# Patient Record
Sex: Female | Born: 1980 | Race: White | Hispanic: No | Marital: Married | Smoking: Former smoker
Health system: Southern US, Community
[De-identification: ages and names within clinical notes are randomized; demographics above are authoritative.]

## PROBLEM LIST (undated history)

## (undated) DIAGNOSIS — D649 Anemia, unspecified: Secondary | ICD-10-CM

## (undated) DIAGNOSIS — F32A Depression, unspecified: Secondary | ICD-10-CM

## (undated) DIAGNOSIS — E162 Hypoglycemia, unspecified: Secondary | ICD-10-CM

## (undated) HISTORY — DX: Depression, unspecified: F32.A

## (undated) HISTORY — DX: Anemia, unspecified: D64.9

---
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
## 2001-09-21 ENCOUNTER — Other Ambulatory Visit: Admission: RE | Admit: 2001-09-21 | Discharge: 2001-09-21 | Payer: Self-pay | Admitting: Obstetrics and Gynecology

## 2001-09-21 ENCOUNTER — Ambulatory Visit (HOSPITAL_COMMUNITY): Admission: RE | Admit: 2001-09-21 | Discharge: 2001-09-21 | Payer: Self-pay | Admitting: Obstetrics and Gynecology

## 2001-09-21 ENCOUNTER — Encounter: Payer: Self-pay | Admitting: Obstetrics and Gynecology

## 2001-09-23 ENCOUNTER — Inpatient Hospital Stay (HOSPITAL_COMMUNITY): Admission: AD | Admit: 2001-09-23 | Discharge: 2001-09-23 | Payer: Self-pay | Admitting: Obstetrics and Gynecology

## 2001-12-10 ENCOUNTER — Encounter (HOSPITAL_COMMUNITY): Admission: AD | Admit: 2001-12-10 | Discharge: 2001-12-19 | Payer: Self-pay | Admitting: Obstetrics and Gynecology

## 2001-12-20 ENCOUNTER — Inpatient Hospital Stay (HOSPITAL_COMMUNITY): Admission: AD | Admit: 2001-12-20 | Discharge: 2001-12-22 | Payer: Self-pay | Admitting: Obstetrics and Gynecology

## 2004-06-05 ENCOUNTER — Inpatient Hospital Stay (HOSPITAL_COMMUNITY): Admission: AD | Admit: 2004-06-05 | Discharge: 2004-06-05 | Payer: Self-pay | Admitting: Obstetrics and Gynecology

## 2004-10-04 ENCOUNTER — Other Ambulatory Visit: Admission: RE | Admit: 2004-10-04 | Discharge: 2004-10-04 | Payer: Self-pay | Admitting: Obstetrics and Gynecology

## 2004-12-07 ENCOUNTER — Emergency Department (HOSPITAL_COMMUNITY): Admission: EM | Admit: 2004-12-07 | Discharge: 2004-12-07 | Payer: Self-pay | Admitting: Emergency Medicine

## 2007-05-25 ENCOUNTER — Inpatient Hospital Stay (HOSPITAL_COMMUNITY): Admission: AD | Admit: 2007-05-25 | Discharge: 2007-05-25 | Payer: Self-pay | Admitting: Obstetrics and Gynecology

## 2007-08-17 ENCOUNTER — Inpatient Hospital Stay (HOSPITAL_COMMUNITY): Admission: RE | Admit: 2007-08-17 | Discharge: 2007-08-19 | Payer: Self-pay | Admitting: Obstetrics and Gynecology

## 2008-07-28 ENCOUNTER — Emergency Department (HOSPITAL_COMMUNITY): Admission: EM | Admit: 2008-07-28 | Discharge: 2008-07-28 | Payer: Self-pay | Admitting: Family Medicine

## 2010-07-23 NOTE — Discharge Summary (Signed)
NAME:  Crystal Lester, Crystal Lester               ACCOUNT NO.:  [id number]   MEDICAL RECORD NO.:  [id number]          PATIENT TYPE:  INP   LOCATION:  9131                          FACILITY:  [facility]   PHYSICIAN:  Zenaida Niece, M.D.DATE OF BIRTH:  December 20, 1980   DATE OF ADMISSION:  08/17/2007  DATE OF DISCHARGE:  08/19/2007                               DISCHARGE SUMMARY   ADMISSION DIAGNOSIS:  Intrauterine pregnancy at 40 weeks.   DISCHARGE DIAGNOSIS:  Intrauterine pregnancy at 40 weeks.   PROCEDURE:  On August 17, 2007, she had a spontaneous vaginal delivery.   HISTORY AND PHYSICAL:  This is a 30 year old white female gravida 4,  para 3-0-0-3 with an EGA of 40-plus weeks who presents for elective  induction.  Prenatal care was complicated by depression, treated with  Zoloft, and was otherwise essentially uncomplicated.   PRENATAL LABS:  Blood type is A negative with a negative antibody  screen.  RPR nonreactive.  Rubella immune.  Hepatitis B surface antigen  negative.  HIV negative.  Gonorrhea and Chlamydia negative.  Cystic  fibrosis negative.  Quad screen normal.  A 1-hour Glucola 81.  Group B  strep is negative.   PAST OB HISTORY:  Three vaginal deliveries at term without  complications.   PAST MEDICAL HISTORY:  1. Asthma.  2. Depression.  3. Irritable bowel syndrome.  4. Diverticulosis.   ALLERGIES:  Allergies to IODINE.   MEDICATIONS:  Zoloft.   PHYSICAL EXAMINATION:  VITAL SIGNS:  She is afebrile with stable vital  signs.  Fetal heart tracing is reactive with occasional contractions.  ABDOMEN:  Gravid and nontender with an estimated fetal weight of 8-9  pounds. Cervix is 1-2, 50, -2 to -3, vertex presentation, adequate  pelvis, and membranes were ruptured viewing clear fluid.   HOSPITAL COURSE:  The patient was admitted and started on Pitocin.  I  then examined her and was able to rupture her membranes.  She progressed  into active labor, required Stadol and then an epidural.  She  then  progressed to complete and pushed well.  On the evening of August 17, 2007,  had a vaginal delivery of a viable female infant with Apgars of 8 and 9  and weighed 8 pounds 9 ounces.  There was a loose nuchal cord x1 that  was reduced.  Placenta was delivered spontaneously and was intact.  She  had intermittent uterine atony, which responded to uterine massage,  Pitocin, and IM Methergine.  Perineum had superficial abrasions, which  were hemostatic and not repaired.  Estimated blood loss was 600 mL.  Postpartum, she had no significant complications.  Predelivery  hemoglobin was 10.5 and postpartum was 10.2, despite the blood loss.  On  postpartum day #2, she was felt to be stable enough for discharge home.   DISCHARGE INSTRUCTIONS:  Regular diet.  Pelvic rest.  Follow up in 6  weeks.   MEDICATIONS:  Percocet, #20, 1-2 p.o. q.4-6 h. p.r.n. pain, and over-the-  counter ibuprofen as needed, and she is given our discharge pamphlet.      Zenaida Niece, M.D.  Electronically Signed     TDM/MEDQ  D:  08/19/2007  T:  08/20/2007  Job:  [id number]

## 2010-07-26 NOTE — Discharge Summary (Signed)
NAME:  Crystal Lester, Crystal Lester                         ACCOUNT NO.:  [id number]   MEDICAL RECORD NO.:  [id number]                   PATIENT TYPE:  INP   LOCATION:  9133                                 [facility]ACILITY:  [facility]   PHYSICIAN:  Zenaida Niece, M.D.             DATE OF BIRTH:  1980-03-12   DATE OF ADMISSION:  12/20/2001  DATE OF DISCHARGE:  12/22/2001                                 DISCHARGE SUMMARY   ADMISSION DIAGNOSES:  Intrauterine pregnancy at 41 weeks.   DISCHARGE DIAGNOSES:  Intrauterine pregnancy at 41 weeks.   PROCEDURE:  Spontaneous vaginal delivery.   HISTORY AND PHYSICAL:  This is a 30 year old white female gravida 3, para 2-  0-0-2 with an EGA of [redacted] weeks by a 28 week ultrasound with a due date of  October 5 who presents for induction with a favorable cervix.  She had  occasional contractions.  No bleeding or ruptured membranes.  Good fetal  movement.  Prenatal care complicated by late care at approximately 28 weeks,  anemia treated with iron, hemorrhoids, and reactive nonstress test since  going past her due date.   PRENATAL LABORATORIES:  Blood type is A- with a negative antibody screen.  RPR nonreactive.  Rubella immune.  Hepatitis B surface antigen negative.  HIV negative.  Gonorrhea and Chlamydia negative.  One hour Glucola 84.  Group B Strep is negative.   PAST OB HISTORY:  August 14, 1999 vaginal delivery at 40 weeks 7 pounds 4  ounces.  She had retained placenta.  June 2002 vaginal delivery at 40 weeks  7 pounds 15 ounces.  No complications.   GYN HISTORY:  Chlamydia in 2000 and a history of condylomata.   PAST MEDICAL HISTORY:  History of a heart murmur.  History of frequent UTIs.  History of depression and a hairline fracture right ankle.   MEDICATIONS:  Chromagen.   PHYSICAL EXAMINATION:  VITAL SIGNS:  She is afebrile with stable vital  signs.  Fetal heart tracing reactive with irregular contractions.  ABDOMEN:  Gravid, nontender with an estimated fetal  weight of 7.5 pounds.  PELVIC:  Vaginal examination is 3, 60, -1, vertex presentation, adequate  pelvis, and amniotomy revealed clear fluid.   HOSPITAL COURSE:  The patient was admitted and had the above mentioned  amniotomy for induction.  She then required low dose Pitocin to enter labor.  She entered active labor and received an epidural.  She progressed fairly  slowly and then progressed quickly and on the afternoon of October 13 had a  vaginal delivery of a viable female infant with Apgars of 8 and 9 that  weighed 8 pounds 8 ounces.  Placenta delivered spontaneous, was intact.  Perineum was intact and estimated blood loss was less than 500 cc.  Postpartum she did very well, remained afebrile, and breast-fed her baby  without complications.  Pre delivery hemoglobin 10.4, post delivery 10.0.  On the  morning of postpartum day number two she was stable for discharge  home.   DISCHARGE INSTRUCTIONS:  Diet is a regular diet.  Activity is pelvic rest.  Follow up in four to six weeks.  Medications are over-the-counter Motrin or  Aleve p.r.n.  She is given our discharge pamphlet.                                               Zenaida Niece, M.D.    TDM/MEDQ  D:  12/22/2001  T:  12/22/2001  Job:  [id number]
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
## 2010-12-02 LAB — RH IMMUNE GLOBULIN WORKUP (NOT WOMEN'S HOSP): ABO/RH(D): A NEG

## 2010-12-02 LAB — HIV ANTIBODY (ROUTINE TESTING W REFLEX): HIV: NONREACTIVE

## 2010-12-02 LAB — RPR: RPR Ser Ql: NONREACTIVE

## 2010-12-05 LAB — CBC
HCT: 27.9 — ABNORMAL LOW
HCT: 29.4 — ABNORMAL LOW
HCT: 29.7 — ABNORMAL LOW
Hemoglobin: 10 — ABNORMAL LOW
Hemoglobin: 10.2 — ABNORMAL LOW
Hemoglobin: 10.5 — ABNORMAL LOW
MCHC: 34.8
MCHC: 35.9
MCV: 95.3
MCV: 97.9
MCV: 97.9
Platelets: 234
RBC: 2.92 — ABNORMAL LOW
RBC: 3.04 — ABNORMAL LOW
RDW: 13.3
WBC: 11.8 — ABNORMAL HIGH

## 2010-12-25 ENCOUNTER — Inpatient Hospital Stay (INDEPENDENT_AMBULATORY_CARE_PROVIDER_SITE_OTHER)
Admission: RE | Admit: 2010-12-25 | Discharge: 2010-12-25 | Disposition: A | Discharge status: Home / Self Care | Payer: Medicaid Other | Source: Ambulatory Visit | Attending: Family Medicine | Admitting: Family Medicine

## 2010-12-25 DIAGNOSIS — E162 Hypoglycemia, unspecified: Secondary | ICD-10-CM

## 2011-05-17 ENCOUNTER — Emergency Department (HOSPITAL_COMMUNITY)
Admission: EM | Admit: 2011-05-17 | Discharge: 2011-05-17 | Disposition: A | Discharge status: Home / Self Care | Payer: BC Managed Care – PPO | Source: Home / Self Care | Attending: Family Medicine | Admitting: Family Medicine

## 2011-05-17 ENCOUNTER — Encounter (HOSPITAL_COMMUNITY): Payer: Self-pay

## 2011-05-17 DIAGNOSIS — J029 Acute pharyngitis, unspecified: Secondary | ICD-10-CM

## 2011-05-17 MED ORDER — AMOXICILLIN 500 MG PO CAPS: 500.0000 mg | ORAL_CAPSULE | Freq: Three times a day (TID) | ORAL | Status: AC

## 2011-05-17 MED ORDER — HYDROCODONE-ACETAMINOPHEN 7.5-325 MG/15ML PO SOLN: 15.0000 mL | Freq: Four times a day (QID) | ORAL | Status: AC | PRN

## 2011-05-17 NOTE — ED Notes (Signed)
Pt has sorethroat, neck pain and dizziness since yesterday.

## 2011-05-17 NOTE — Discharge Instructions (Signed)
Use elixir for pain relief of throat. Also, I recommend aggressive pain and fever control with acetaminophen (Tylenol) and/or ibuprofen. You may use these together, alternating them every 4 hours, or individually, every 8 hours. For example, take acetaminophen 500 to 1000 mg at 12 noon, then 600 to 800 mg of ibuprofen at 4 pm, then acetaminophen at 8 pm, etc. Also, stay hydrated with clear liquids. If no relief in 48 to 72 hours, fill antibiotic rx and complete course. Return to care should your symptoms not improve, or worsen in any way.

## 2011-05-17 NOTE — ED Provider Notes (Signed)
History     CSN: [id number]  Arrival date & time 05/17/11  1723   First MD Initiated Contact with Patient 05/17/11 1801      Chief Complaint  Patient presents with  . Sore Throat    (Consider location/radiation/quality/duration/timing/severity/associated sxs/prior treatment) HPI Comments: Crystal Lester presents for evaluation of sore throat since yesterday. She reports chills, minimal cough. She reports a severe episode of strep throat last year. She reports some difficulty with swallowing but is tolerating PO well.   Patient is a 31 y.o. female presenting with pharyngitis. The history is provided by the patient.  Sore Throat This is a new problem. The current episode started yesterday. The problem has not changed since onset.The symptoms are aggravated by swallowing and eating. The symptoms are relieved by nothing.    History reviewed. No pertinent past medical history.  History reviewed. No pertinent past surgical history.  History reviewed. No pertinent family history.  History  Substance Use Topics  . Smoking status: Current Everyday Smoker -- 0.5 packs/day  . Smokeless tobacco: Not on file  . Alcohol Use: Yes    OB History    Grav Para Term Preterm Abortions TAB SAB Ect Mult Living                  Review of Systems  Constitutional: Positive for chills.  HENT: Positive for sore throat, trouble swallowing and postnasal drip.   Eyes: Negative.   Respiratory: Negative.  Negative for cough.   Cardiovascular: Negative.   Gastrointestinal: Negative.   Genitourinary: Negative.   Musculoskeletal: Negative.   Skin: Negative.   Neurological: Negative.     Allergies  Iodine  Home Medications   Current Outpatient Rx  Name Route Sig Dispense Refill  . SERTRALINE HCL 100 MG PO TABS Oral Take 100 mg by mouth daily.    . AMOXICILLIN 500 MG PO CAPS Oral Take 1 capsule (500 mg total) by mouth 3 (three) times daily. 30 capsule 0  . HYDROCODONE-ACETAMINOPHEN 7.5-325 MG/15ML PO  SOLN Oral Take 15 mLs by mouth 4 (four) times daily as needed for pain. 473 mL 0    LMP 05/04/2011  Physical Exam  Nursing note and vitals reviewed. Constitutional: She is oriented to person, place, and time. She appears well-developed and well-nourished.  HENT:  Head: Normocephalic and atraumatic.  Right Ear: Tympanic membrane normal.  Left Ear: Tympanic membrane normal.  Mouth/Throat: Uvula is midline, oropharynx is clear and moist and mucous membranes are normal. No oropharyngeal exudate.  Eyes: EOM are normal.  Neck: Normal range of motion.  Pulmonary/Chest: Effort normal and breath sounds normal. She has no decreased breath sounds. She has no wheezes. She has no rhonchi.  Musculoskeletal: Normal range of motion.  Neurological: She is alert and oriented to person, place, and time.  Skin: Skin is warm and dry.  Psychiatric: Her behavior is normal.    ED Course  Procedures (including critical care time)   Labs Reviewed  POCT RAPID STREP A ([facility] ONLY)  STREP A DNA PROBE   No results found.   1. Pharyngitis       MDM  Rapid strep negative; sent for culture; clinically appears viral but given delayed rx for amox; will follow culture        Crystal Munda, MD 05/17/11 1918

## 2011-05-18 LAB — STREP A DNA PROBE

## 2011-06-03 ENCOUNTER — Emergency Department (HOSPITAL_COMMUNITY)
Admission: EM | Admit: 2011-06-03 | Discharge: 2011-06-03 | Disposition: A | Discharge status: Home / Self Care | Payer: Worker's Compensation | Attending: Emergency Medicine | Admitting: Emergency Medicine

## 2011-06-03 ENCOUNTER — Encounter (HOSPITAL_COMMUNITY): Payer: Self-pay | Admitting: Emergency Medicine

## 2011-06-03 DIAGNOSIS — Y990 Civilian activity done for income or pay: Secondary | ICD-10-CM | POA: Insufficient documentation

## 2011-06-03 DIAGNOSIS — W268XXA Contact with other sharp object(s), not elsewhere classified, initial encounter: Secondary | ICD-10-CM | POA: Insufficient documentation

## 2011-06-03 DIAGNOSIS — S61412A Laceration without foreign body of left hand, initial encounter: Secondary | ICD-10-CM

## 2011-06-03 DIAGNOSIS — Y9289 Other specified places as the place of occurrence of the external cause: Secondary | ICD-10-CM | POA: Insufficient documentation

## 2011-06-03 DIAGNOSIS — S61409A Unspecified open wound of unspecified hand, initial encounter: Secondary | ICD-10-CM | POA: Insufficient documentation

## 2011-06-03 DIAGNOSIS — F17200 Nicotine dependence, unspecified, uncomplicated: Secondary | ICD-10-CM | POA: Insufficient documentation

## 2011-06-03 HISTORY — DX: Hypoglycemia, unspecified: E16.2

## 2011-06-03 MED ORDER — TETANUS-DIPHTH-ACELL PERTUSSIS 5-2.5-18.5 LF-MCG/0.5 IM SUSP
0.5000 mL | Freq: Once | INTRAMUSCULAR | Status: AC
  Administered 2011-06-03: 0.5 mL via INTRAMUSCULAR
  Filled 2011-06-03: qty 0.5

## 2011-06-03 MED ORDER — ACETAMINOPHEN 325 MG PO TABS
650.0000 mg | ORAL_TABLET | Freq: Once | ORAL | Status: AC
Start: 2011-06-03 — End: 2011-06-03
  Administered 2011-06-03: 650 mg via ORAL
  Filled 2011-06-03: qty 2

## 2011-06-03 NOTE — ED Notes (Signed)
Pt states she tripped at work and grabbed a metal rack and cut her left hand  Pt has a small lac noted to the palm of her left hand

## 2011-06-03 NOTE — ED Notes (Signed)
Left palm laceration irrigated and cleaned. Pt c/o stinging in the area

## 2011-06-03 NOTE — ED Provider Notes (Signed)
History     CSN: [id number]  Arrival date & time 06/03/11  0051   First MD Initiated Contact with Patient 06/03/11 0305      Chief Complaint  Patient presents with  . Laceration    (Consider location/radiation/quality/duration/timing/severity/associated sxs/prior treatment) HPI  Patient presents to emergency department complaining of left hand laceration while she was at work a few hours prior to arrival. Patient states she tripped and grabbed onto a metal rack causing laceration of her left palm. Patient is unsure of her last tetanus exam. Patient states pain with full range of motion of the hand with little to no pain with hand kept still. Patient has taken nothing for pain prior to arrival. When his hemostatic. Patient denies any chance of foreign body stating that she cut her hand on the sharp metal. Patient is no other complaints. She denies additional injury. Symptoms are acute onset, persistent, and unchanging.  Past Medical History  Diagnosis Date  . Hypoglycemia     History reviewed. No pertinent past surgical history.  Family History  Problem Relation Age of Onset  . Cancer Other     History  Substance Use Topics  . Smoking status: Current Everyday Smoker -- 0.5 packs/day    Types: Cigarettes  . Smokeless tobacco: Not on file  . Alcohol Use: Yes     social    OB History    Grav Para Term Preterm Abortions TAB SAB Ect Mult Living                  Review of Systems  All other systems reviewed and are negative.    Allergies  Iodine  Home Medications   Current Outpatient Rx  Name Route Sig Dispense Refill  . AMOXICILLIN 500 MG PO CAPS Oral Take 500 mg by mouth 3 (three) times daily.    Marland Kitchen CLONAZEPAM 0.5 MG PO TABS Oral Take 0.5 mg by mouth 3 (three) times daily as needed. Anxiety    . HYDROCODONE-ACETAMINOPHEN 7.5-500 MG/15ML PO SOLN Oral Take by mouth every 6 (six) hours as needed. Pain    . ADULT MULTIVITAMIN W/MINERALS CH Oral Take 1 tablet by  mouth daily.    . SERTRALINE HCL 100 MG PO TABS Oral Take 100 mg by mouth daily.      BP 108/57  Pulse 80  Temp(Src) 98.6 F (37 C) (Oral)  Resp 18  SpO2 100%  LMP 04/27/2011  Physical Exam  Constitutional: She is oriented to person, place, and time. She appears well-developed and well-nourished. No distress.  HENT:  Head: Normocephalic and atraumatic.  Eyes: Conjunctivae are normal.  Cardiovascular: Normal rate and regular rhythm.   Pulmonary/Chest: Effort normal.  Musculoskeletal: Normal range of motion. She exhibits tenderness. She exhibits no edema.       Right ankle: She exhibits swelling. tenderness.       Hands:      "V" shaped superficial laceration of left palm. FROM of entire hand against resistance without difficuclty. Good cap refill of all digits. Hemostatic wound.   Neurological: She is alert and oriented to person, place, and time.       Normal sensation of entire foot.   Skin: Skin is warm and dry. No rash noted. She is not diaphoretic. No erythema. No pallor.  Psychiatric: She has a normal mood and affect. Her behavior is normal.    ED Course  Procedures (including critical care time) PO tylenol  IM tetanus   Wound Care:  Wound cleaned  thoroughly with safe cleanse. No foreign body seen or palpated. Hemostatic. Skin secured with Steri-Strips. Clean dressing applied. No anesthetic. Patient tolerated procedure well.  Labs Reviewed - No data to display No results found.   1. Laceration of left hand       MDM  superficial lac with no FB seen or palpated. FROM of hand without difficulty. Wound hemostatic. Left hand neurovasc intact.         Jenness Corner, [place] 06/03/11 [phone]  Medical screening examination/treatment/procedure(s) were performed by non-physician practitioner and as supervising physician I was immediately available for consultation/collaboration.   Sunnie Nielsen, MD 06/04/11 [phone]

## 2011-06-03 NOTE — Discharge Instructions (Signed)

## 2013-11-17 ENCOUNTER — Emergency Department (HOSPITAL_COMMUNITY): Payer: Medicaid Other

## 2013-11-17 ENCOUNTER — Emergency Department (HOSPITAL_COMMUNITY)
Admission: EM | Admit: 2013-11-17 | Discharge: 2013-11-17 | Disposition: A | Discharge status: Home / Self Care | Payer: Medicaid Other | Attending: Emergency Medicine | Admitting: Emergency Medicine

## 2013-11-17 ENCOUNTER — Encounter (HOSPITAL_COMMUNITY): Payer: Self-pay | Admitting: Emergency Medicine

## 2013-11-17 ENCOUNTER — Emergency Department (INDEPENDENT_AMBULATORY_CARE_PROVIDER_SITE_OTHER)
Admission: EM | Admit: 2013-11-17 | Discharge: 2013-11-17 | Disposition: A | Discharge status: Home / Self Care | Payer: Medicaid Other | Source: Home / Self Care | Attending: Family Medicine | Admitting: Family Medicine

## 2013-11-17 DIAGNOSIS — R079 Chest pain, unspecified: Secondary | ICD-10-CM | POA: Diagnosis not present

## 2013-11-17 DIAGNOSIS — R0789 Other chest pain: Secondary | ICD-10-CM

## 2013-11-17 DIAGNOSIS — R0602 Shortness of breath: Secondary | ICD-10-CM | POA: Insufficient documentation

## 2013-11-17 DIAGNOSIS — R61 Generalized hyperhidrosis: Secondary | ICD-10-CM | POA: Insufficient documentation

## 2013-11-17 DIAGNOSIS — Z8639 Personal history of other endocrine, nutritional and metabolic disease: Secondary | ICD-10-CM | POA: Insufficient documentation

## 2013-11-17 DIAGNOSIS — F17200 Nicotine dependence, unspecified, uncomplicated: Secondary | ICD-10-CM | POA: Insufficient documentation

## 2013-11-17 DIAGNOSIS — Z79899 Other long term (current) drug therapy: Secondary | ICD-10-CM | POA: Diagnosis not present

## 2013-11-17 DIAGNOSIS — Z862 Personal history of diseases of the blood and blood-forming organs and certain disorders involving the immune mechanism: Secondary | ICD-10-CM | POA: Diagnosis not present

## 2013-11-17 DIAGNOSIS — R002 Palpitations: Secondary | ICD-10-CM | POA: Insufficient documentation

## 2013-11-17 LAB — BASIC METABOLIC PANEL
Anion gap: 12 (ref 5–15)
BUN: 10 mg/dL (ref 6–23)
CO2: 23 mEq/L (ref 19–32)
Calcium: 8.7 mg/dL (ref 8.4–10.5)
Chloride: 106 mEq/L (ref 96–112)
Creatinine, Ser: 0.62 mg/dL (ref 0.50–1.10)
GFR calc Af Amer: >90 mL/min (ref >90)
GFR calc non Af Amer: >90 mL/min (ref >90)
Glucose, Bld: 82 mg/dL (ref 70–99)
Potassium: 4.1 mEq/L (ref 3.7–5.3)
Sodium: 141 mEq/L (ref 137–147)

## 2013-11-17 LAB — CBC
HCT: 37 % (ref 36.0–46.0)
Hemoglobin: 12.4 g/dL (ref 12.0–15.0)
MCH: 32.1 pg (ref 26.0–34.0)
MCHC: 33.5 g/dL (ref 30.0–36.0)
MCV: 95.9 fL (ref 78.0–100.0)
Platelets: 213 10*3/uL (ref 150–400)
RBC: 3.86 MIL/uL — ABNORMAL LOW (ref 3.87–5.11)
RDW: 12.6 % (ref 11.5–15.5)
WBC: 6.8 10*3/uL (ref 4.0–10.5)

## 2013-11-17 LAB — I-STAT TROPONIN, ED
Troponin i, poc: 0 ng/mL (ref 0.00–0.08)
Troponin i, poc: 0 ng/mL (ref 0.00–0.08)

## 2013-11-17 LAB — PRO B NATRIURETIC PEPTIDE: Pro B Natriuretic peptide (BNP): 46.1 pg/mL (ref 0–125)

## 2013-11-17 MED ORDER — SODIUM CHLORIDE 0.9 % IV SOLN
Freq: Once | INTRAVENOUS | Status: AC
  Administered 2013-11-17: 13:00:00 via INTRAVENOUS

## 2013-11-17 MED ORDER — NITROGLYCERIN 0.4 MG SL SUBL
SUBLINGUAL_TABLET | SUBLINGUAL | Status: AC
  Filled 2013-11-17: qty 1

## 2013-11-17 MED ORDER — KETOROLAC TROMETHAMINE 30 MG/ML IJ SOLN
30.0000 mg | Freq: Once | INTRAMUSCULAR | Status: AC
  Administered 2013-11-17: 30 mg via INTRAVENOUS
  Filled 2013-11-17: qty 1

## 2013-11-17 MED ORDER — TRAMADOL HCL 50 MG PO TABS: 50.0000 mg | ORAL_TABLET | Freq: Four times a day (QID) | ORAL | Status: DC | PRN

## 2013-11-17 MED ORDER — NITROGLYCERIN 0.4 MG SL SUBL
0.4000 mg | SUBLINGUAL_TABLET | SUBLINGUAL | Status: DC | PRN
  Administered 2013-11-17: 0.4 mg via SUBLINGUAL

## 2013-11-17 NOTE — ED Provider Notes (Signed)
CSN: [id number]     Arrival date & time 11/17/13  1303 History   First MD Initiated Contact with Patient 11/17/13 1305     Chief Complaint  Patient presents with  . Chest Pain     (Consider location/radiation/quality/duration/timing/severity/associated sxs/prior Treatment) HPI Pt is a 33yo female with hx of hypoglycemia presenting to ED from urgent care for further evaluation and treatment of chest pain that started suddenly around 5AM this morning, has been constant since onset.  Chest pain is centralized and radiating just slightly to left of sternum, described as a squeezing sensation, aching and sharp, 3/10.  Pt has received 325mg  aspirin and Tagamet this morning that did not help with pain. Pain associated with nausea, mild shortness of breath due to pain, palpitations and feeling of being clammy and sweaty.  Pt states moving, including leaning forward, backward, or turning left or right does increase pain, however, pain subsides when rest no matter the position she is in.  Pt did receive 0.4mg  SL nitro by Urgent care PTA, that did improve her CP from 5/10 to 3/10.  Denies vomting or diarrhea. Denies personal hx of cardiopulmonary disease. Does report FH of father having an MI at 33.  Pt is a current 0.5ppd smoker.    Past Medical History  Diagnosis Date  . Hypoglycemia    History reviewed. No pertinent past surgical history. Family History  Problem Relation Age of Onset  . Cancer Other    History  Substance Use Topics  . Smoking status: Current Every Day Smoker -- 0.50 packs/day    Types: Cigarettes  . Smokeless tobacco: Not on file  . Alcohol Use: Yes     Comment: social   OB History   Grav Para Term Preterm Abortions TAB SAB Ect Mult Living                 Review of Systems  Constitutional: Positive for diaphoresis ( clammy, sweating hands). Negative for fever and chills.  Respiratory: Positive for shortness of breath. Negative for cough, wheezing and stridor.    Cardiovascular: Positive for chest pain and palpitations. Negative for leg swelling.  Gastrointestinal: Negative for nausea, vomiting, abdominal pain and diarrhea.  Genitourinary: Negative for dysuria, urgency, hematuria and flank pain.  All other systems reviewed and are negative.     Allergies  Iodine  Home Medications   Prior to Admission medications   Medication Sig Start Date End Date Taking? Authorizing Provider  cimetidine (TAGAMET) 200 MG tablet Take 200 mg by mouth daily.   Yes Historical Provider, MD   BP 103/59  Pulse 55  Temp(Src) 98.3 F (36.8 C) (Oral)  Resp 20  SpO2 100%  LMP 10/17/2013 Physical Exam  Nursing note and vitals reviewed. Constitutional: She appears well-developed and well-nourished. No distress.  HENT:  Head: Normocephalic and atraumatic.  Eyes: Conjunctivae are normal. No scleral icterus.  Neck: Normal range of motion. Neck supple.  No nuchal rigidity or meningeal signs.  Cardiovascular: Normal rate, regular rhythm and normal heart sounds.   Pulmonary/Chest: Effort normal and breath sounds normal. No respiratory distress. She has no wheezes. She has no rales. She exhibits no tenderness.  No respiratory distress. Lung: CTAB. Centralized chest pain with movement, however, pain subsides when pt stops moving.  No chest wall tenderness.   Abdominal: Soft. Bowel sounds are normal. She exhibits no distension and no mass. There is no tenderness. There is no rebound and no guarding.  Musculoskeletal: Normal range of motion.  Neurological:  She is alert.  Skin: Skin is warm and dry. She is not diaphoretic.    ED Course  Procedures (including critical care time) Labs Review Labs Reviewed  CBC - Abnormal; Notable for the following:    RBC 3.86 (*)    All other components within normal limits  PRO B NATRIURETIC PEPTIDE  BASIC METABOLIC PANEL  I-STAT TROPOININ, ED    Imaging Review Dg Chest 2 View  11/17/2013   CLINICAL DATA:  Left-sided chest  pain and shortness of breath.  EXAM: CHEST  2 VIEW  COMPARISON:  None.  FINDINGS: Heart size and pulmonary vascularity are normal and the lungs are clear. No osseous abnormality.  IMPRESSION: Normal chest.   Electronically Signed   By: Rozetta Nunnery M.D.   On: 11/17/2013 13:47     EKG Interpretation   Date/Time:  Thursday November 17 2013 13:11:46 EDT Ventricular Rate:  81 PR Interval:  156 QRS Duration: 97 QT Interval:  374 QTC Calculation: 434 R Axis:   38 Text Interpretation:  Sinus rhythm RSR' in V1 or V2, probably normal  variant No significant change since last tracing Confirmed by YAO  MD,  DAVID ([id number]) on 11/17/2013 1:15:57 PM      MDM   Final diagnoses:  None    Pt is a 33yo female sent to ED from urgent care for further evaluation of chest pain.  Pt does have family hx of CAD and is a 0/5ppd smoker, otherwise, no other risk factors. Pt is low risk for major cardiac event via the HEART score, however, will get delta troponin.  Pain did decreased from 5/10 3/10 after 0.4 SL nitro by urgent care, however, pt initially declined additional pain medication upon arrival to ED.   Discussed pt with Dr. Darl Householder who agrees with delta troponin. Pt is to then be reassess for dispo, but will likely be able to be discharged home to f/u with PCP.    4:19 PM Signed pt out to Cleatrice Burke, PA-C at shift change. Plan is to f/u on delta troponin and reassess symptoms. Likely discharge home to f/u with pcp.    Noland Fordyce, PA-C 11/17/13 1620

## 2013-11-17 NOTE — ED Notes (Signed)
Patient in NAD at time of d/c

## 2013-11-17 NOTE — ED Provider Notes (Signed)
Medical screening examination/treatment/procedure(s) were performed by non-physician practitioner and as supervising physician I was immediately available for consultation/collaboration.   EKG Interpretation   Date/Time:  Thursday November 17 2013 13:11:46 EDT Ventricular Rate:  81 PR Interval:  156 QRS Duration: 97 QT Interval:  374 QTC Calculation: 434 R Axis:   38 Text Interpretation:  Sinus rhythm RSR' in V1 or V2, probably normal  variant No significant change since last tracing Confirmed by YAO  MD,  DAVID ([id number]) on 11/17/2013 1:15:57 PM        Wandra Arthurs, MD 11/17/13 [phone]

## 2013-11-17 NOTE — ED Provider Notes (Signed)
Crystal Lester is a 33 y.o. female who presents to Urgent Care today for chest pain. Patient was awoken this morning at 5 AM with central chest pain. The pain has worsened and become severe. The pain is worse with exertion and at rest. She is took 325 mg of aspirin and Tagamet which have not helped. Earlier the pain was associated with a fluttering sensation in her chest and associated with clammy feeling and sweating. She no longer has palpitations. She additionally notes some shortness of breath. She denies any fevers or chills vomiting or diarrhea. She denies any personal history of heart disease.   Past Medical History  Diagnosis Date  . Hypoglycemia    History  Substance Use Topics  . Smoking status: Current Every Day Smoker -- 0.50 packs/day    Types: Cigarettes  . Smokeless tobacco: Not on file  . Alcohol Use: Yes     Comment: social   ROS as above Medications: Current Facility-Administered Medications  Medication Dose Route Frequency Provider Last Rate Last Dose  . 0.9 %  sodium chloride infusion   Intravenous Once Gregor Hams, MD      . nitroGLYCERIN (NITROSTAT) SL tablet 0.4 mg  0.4 mg Sublingual Q5 min PRN Gregor Hams, MD       Current Outpatient Prescriptions  Medication Sig Dispense Refill  . clonazePAM (KLONOPIN) 0.5 MG tablet Take 0.5 mg by mouth 3 (three) times daily as needed. Anxiety      . HYDROcodone-acetaminophen (LORTAB) 7.5-500 MG/15ML solution Take by mouth every 6 (six) hours as needed. Pain      . Multiple Vitamin (MULITIVITAMIN WITH MINERALS) TABS Take 1 tablet by mouth daily.      . sertraline (ZOLOFT) 100 MG tablet Take 100 mg by mouth daily.        Exam:  BP 136/83  Pulse 78  Temp(Src) 97.9 F (36.6 C) (Oral)  Resp 12  SpO2 99%  LMP 10/17/2013 Gen: Well NAD HEENT: EOMI,  MMM Lungs: Normal work of breathing. CTABL Heart: RRR no MRG Chest wall: Nontender Abd: NABS, Soft. Nondistended, Nontender Exts: Brisk capillary refill, warm and well  perfused.   Twelve-lead EKG shows normal sinus rhythm at 64 beats per minute. No ST segment elevation or depression. Normal EKG.  No results found for this or any previous visit (from the past 24 hour(s)). No results found.  Assessment and Plan: 33 y.o. female with central exertional chest pain. This is obviously concerning. We are unable to further workup her as we do not have access the chest x-ray. Given the severity of her symptoms feel is reasonable to transfer to the emergency department via EMS further evaluation and management. Will additionally dispense 0.4 mg of sublingual nitroglycerin start an IV and provide oxygen therapy.  Discussed warning signs or symptoms. Please see discharge instructions. Patient expresses understanding.   This note was created using Systems analyst. Any transcription errors are unintended.    Gregor Hams, MD 11/17/13 [phone]

## 2013-11-17 NOTE — ED Notes (Signed)
C/o  Left sided chest pain that starts in the center and radiates under left breast.  Pain worse with movement.  On set 5 a.m today.  Pain is constant.  Tingling in lips and hands.  Clammy hands.   No relief with aspirin and tagamet.

## 2013-11-17 NOTE — ED Provider Notes (Signed)
Patient signed out to me by Hilda Blades, PA-C at change of shift. Patient here for chest pain. Sudden onset chest pain. Patient's labs are unremarkable. PERC negative. Plan to wait for delta troponin and likely discharge home with PCP follow up.   Second troponin is negative. Patient comfortable with discharge home. She will follow up with PCP. Discussed reasons to return to ED immediately. Vital signs stable for discharge. Patient / Family / Caregiver informed of clinical course, understand medical decision-making process, and agree with plan.   Results for orders placed during the hospital encounter of 11/17/13  CBC      Result Value Ref Range   WBC 6.8  4.0 - 10.5 K/uL   RBC 3.86 (*) 3.87 - 5.11 MIL/uL   Hemoglobin 12.4  12.0 - 15.0 g/dL   HCT 37.0  36.0 - 46.0 %   MCV 95.9  78.0 - 100.0 fL   MCH 32.1  26.0 - 34.0 pg   MCHC 33.5  30.0 - 36.0 g/dL   RDW 12.6  11.5 - 15.5 %   Platelets 213  150 - 400 K/uL  PRO B NATRIURETIC PEPTIDE      Result Value Ref Range   Pro B Natriuretic peptide (BNP) 46.1  0 - 125 pg/mL  BASIC METABOLIC PANEL      Result Value Ref Range   Sodium 141  137 - 147 mEq/L   Potassium 4.1  3.7 - 5.3 mEq/L   Chloride 106  96 - 112 mEq/L   CO2 23  19 - 32 mEq/L   Glucose, Bld 82  70 - 99 mg/dL   BUN 10  6 - 23 mg/dL   Creatinine, Ser 0.62  0.50 - 1.10 mg/dL   Calcium 8.7  8.4 - 10.5 mg/dL   GFR calc non Af Amer >90  >90 mL/min   GFR calc Af Amer >90  >90 mL/min   Anion gap 12  5 - 15  I-STAT TROPOININ, ED      Result Value Ref Range   Troponin i, poc 0.00  0.00 - 0.08 ng/mL   Comment 3            Dg Chest 2 View  11/17/2013   CLINICAL DATA:  Left-sided chest pain and shortness of breath.  EXAM: CHEST  2 VIEW  COMPARISON:  None.  FINDINGS: Heart size and pulmonary vascularity are normal and the lungs are clear. No osseous abnormality.  IMPRESSION: Normal chest.   Electronically Signed   By: Rozetta Nunnery M.D.   On: 11/17/2013 13:47      Elwyn Lade,  PA-C 11/17/13 2242

## 2013-11-17 NOTE — ED Notes (Signed)
Patient states midline chest pain starting today at 0500, patient states aching and shooting type pain, patient received nitro x 1 pta at Urgent care stating relief from 5/10 to 3/10 pain.

## 2013-11-17 NOTE — Discharge Instructions (Signed)

## 2013-11-17 NOTE — ED Provider Notes (Signed)
I was available for consultation during the completion of this patient's care. Please see the initial providers notes for details regarding the patient's care.   Carmin Muskrat, MD 11/17/13 2258

## 2014-08-02 ENCOUNTER — Other Ambulatory Visit: Payer: Self-pay | Admitting: Family Medicine

## 2014-08-02 DIAGNOSIS — N644 Mastodynia: Secondary | ICD-10-CM

## 2014-08-11 ENCOUNTER — Ambulatory Visit
Admission: RE | Admit: 2014-08-11 | Discharge: 2014-08-11 | Disposition: A | Discharge status: Home / Self Care | Payer: Medicaid Other | Source: Ambulatory Visit | Attending: Family Medicine | Admitting: Family Medicine

## 2014-08-11 DIAGNOSIS — N644 Mastodynia: Secondary | ICD-10-CM

## 2015-01-13 ENCOUNTER — Other Ambulatory Visit: Payer: Self-pay | Admitting: Family Medicine

## 2015-01-13 DIAGNOSIS — K30 Functional dyspepsia: Secondary | ICD-10-CM

## 2015-01-13 DIAGNOSIS — K219 Gastro-esophageal reflux disease without esophagitis: Secondary | ICD-10-CM

## 2015-01-13 DIAGNOSIS — R10811 Right upper quadrant abdominal tenderness: Secondary | ICD-10-CM

## 2015-01-13 DIAGNOSIS — IMO0001 Reserved for inherently not codable concepts without codable children: Secondary | ICD-10-CM

## 2015-01-17 ENCOUNTER — Emergency Department (HOSPITAL_COMMUNITY): Payer: Medicaid Other

## 2015-01-17 ENCOUNTER — Emergency Department (HOSPITAL_COMMUNITY)
Admission: EM | Admit: 2015-01-17 | Discharge: 2015-01-17 | Disposition: A | Discharge status: Home / Self Care | Payer: Medicaid Other | Attending: Emergency Medicine | Admitting: Emergency Medicine

## 2015-01-17 ENCOUNTER — Encounter (HOSPITAL_COMMUNITY): Payer: Self-pay | Admitting: *Deleted

## 2015-01-17 DIAGNOSIS — Z8639 Personal history of other endocrine, nutritional and metabolic disease: Secondary | ICD-10-CM | POA: Diagnosis not present

## 2015-01-17 DIAGNOSIS — Z720 Tobacco use: Secondary | ICD-10-CM | POA: Diagnosis not present

## 2015-01-17 DIAGNOSIS — Z3202 Encounter for pregnancy test, result negative: Secondary | ICD-10-CM | POA: Insufficient documentation

## 2015-01-17 DIAGNOSIS — R1013 Epigastric pain: Secondary | ICD-10-CM | POA: Diagnosis not present

## 2015-01-17 DIAGNOSIS — Z79899 Other long term (current) drug therapy: Secondary | ICD-10-CM | POA: Diagnosis not present

## 2015-01-17 DIAGNOSIS — R1011 Right upper quadrant pain: Secondary | ICD-10-CM | POA: Diagnosis not present

## 2015-01-17 LAB — URINALYSIS, ROUTINE W REFLEX MICROSCOPIC
GLUCOSE, UA: NEGATIVE mg/dL
HGB URINE DIPSTICK: NEGATIVE
KETONES UR: 15 mg/dL — AB
NITRITE: NEGATIVE
PH: 5 (ref 5.0–8.0)
PROTEIN: NEGATIVE mg/dL
Specific Gravity, Urine: 1.031 — ABNORMAL HIGH (ref 1.005–1.030)
UROBILINOGEN UA: 1 mg/dL (ref 0.0–1.0)

## 2015-01-17 LAB — COMPREHENSIVE METABOLIC PANEL
ALT: 16 U/L (ref 14–54)
ANION GAP: 7 (ref 5–15)
AST: 17 U/L (ref 15–41)
Albumin: 3.7 g/dL (ref 3.5–5.0)
Alkaline Phosphatase: 62 U/L (ref 38–126)
BILIRUBIN TOTAL: 0.6 mg/dL (ref 0.3–1.2)
BUN: 11 mg/dL (ref 6–20)
CALCIUM: 9.1 mg/dL (ref 8.9–10.3)
CO2: 22 mmol/L (ref 22–32)
Chloride: 107 mmol/L (ref 101–111)
Creatinine, Ser: 0.76 mg/dL (ref 0.44–1.00)
Glucose, Bld: 88 mg/dL (ref 65–99)
POTASSIUM: 3.9 mmol/L (ref 3.5–5.1)
Sodium: 136 mmol/L (ref 135–145)
TOTAL PROTEIN: 6.5 g/dL (ref 6.5–8.1)

## 2015-01-17 LAB — CBC
HEMATOCRIT: 40 % (ref 36.0–46.0)
HEMOGLOBIN: 13.6 g/dL (ref 12.0–15.0)
MCH: 32.5 pg (ref 26.0–34.0)
MCHC: 34 g/dL (ref 30.0–36.0)
MCV: 95.7 fL (ref 78.0–100.0)
Platelets: 197 10*3/uL (ref 150–400)
RBC: 4.18 MIL/uL (ref 3.87–5.11)
RDW: 12.5 % (ref 11.5–15.5)
WBC: 5.5 10*3/uL (ref 4.0–10.5)

## 2015-01-17 LAB — URINE MICROSCOPIC-ADD ON

## 2015-01-17 LAB — LIPASE, BLOOD: Lipase: 22 U/L (ref 11–51)

## 2015-01-17 LAB — POC URINE PREG, ED: Preg Test, Ur: NEGATIVE

## 2015-01-17 MED ORDER — MORPHINE SULFATE (PF) 4 MG/ML IV SOLN
4.0000 mg | Freq: Once | INTRAVENOUS | Status: AC
  Administered 2015-01-17: 4 mg via INTRAVENOUS
  Filled 2015-01-17: qty 1

## 2015-01-17 MED ORDER — ONDANSETRON HCL 4 MG/2ML IJ SOLN
4.0000 mg | Freq: Once | INTRAMUSCULAR | Status: AC
  Administered 2015-01-17: 4 mg via INTRAVENOUS
  Filled 2015-01-17: qty 2

## 2015-01-17 MED ORDER — RANITIDINE HCL 150 MG PO TABS: 150.0000 mg | ORAL_TABLET | Freq: Two times a day (BID) | ORAL | Status: DC

## 2015-01-17 MED ORDER — SODIUM CHLORIDE 0.9 % IV BOLUS (SEPSIS)
1000.0000 mL | Freq: Once | INTRAVENOUS | Status: AC
  Administered 2015-01-17: 1000 mL via INTRAVENOUS

## 2015-01-17 NOTE — ED Notes (Signed)
Pt tearful, pt states, "Someone talked to me like I thought they shouldn't & they were not compassionate." pt declines speaking to the charge RN at this time, pt does not disclose who was involved with the encounter, this RN apologized for her experience & the pt declines to discuss the experience further

## 2015-01-17 NOTE — ED Provider Notes (Signed)
CSN: [id number]     Arrival date & time 01/17/15  2094 History   First MD Initiated Contact with Patient 01/17/15 1016     Chief Complaint  Patient presents with  . Abdominal Pain     (Consider location/radiation/quality/duration/timing/severity/associated sxs/prior Treatment) HPI  Pt presenting with c/o right upper abdominal pain.  She states symptoms have been ongoing for the past week.  Has had some nasuea associated but no vomiting.  Pain radiates to the right side of her back.  No fever/chills.  No change in stools.  No dysuria.  She was seen by her PMD last week for her symptoms and ultrasound was ordered but has not had this yet.  Pain is constant and described as a tenderness.  There are no other associated systemic symptoms, there are no other alleviating or modifying factors.   Past Medical History  Diagnosis Date  . Hypoglycemia    History reviewed. No pertinent past surgical history. Family History  Problem Relation Age of Onset  . Cancer Other    Social History  Substance Use Topics  . Smoking status: Current Every Day Smoker -- 0.50 packs/day    Types: Cigarettes  . Smokeless tobacco: None  . Alcohol Use: Yes     Comment: social   OB History    No data available     Review of Systems  ROS reviewed and all otherwise negative except for mentioned in HPI    Allergies  Iodine  Home Medications   Prior to Admission medications   Medication Sig Start Date End Date Taking? Authorizing Provider  diazepam (VALIUM) 5 MG tablet Take 5 mg by mouth daily as needed for anxiety.   Yes Historical Provider, MD  omeprazole (PRILOSEC) 20 MG capsule Take 20 mg by mouth daily.   Yes Historical Provider, MD  sertraline (ZOLOFT) 100 MG tablet Take 100 mg by mouth at bedtime.   Yes Historical Provider, MD  ranitidine (ZANTAC) 150 MG tablet Take 1 tablet (150 mg total) by mouth 2 (two) times daily. 01/17/15   Alfonzo Beers, MD  traMADol (ULTRAM) 50 MG tablet Take 1 tablet (50 mg  total) by mouth every 6 (six) hours as needed. Patient not taking: Reported on 01/17/2015 11/17/13   Cleatrice Burke, PA-C   BP 107/58 mmHg  Pulse 57  Temp(Src) 98.2 F (36.8 C) (Oral)  Resp 18  Ht 5' 8.5" (1.74 m)  Wt 220 lb (99.791 kg)  BMI 32.96 kg/m2  SpO2 99%  LMP 12/28/2014  Vitals reviewed Physical Exam  Physical Examination: General appearance - alert, well appearing, and in no distress Mental status - alert, oriented to person, place, and time Eyes - no conjunctival injection, no scleral icterus Mouth - mucous membranes moist, pharynx normal without lesions Chest - clear to auscultation, no wheezes, rales or rhonchi, symmetric air entry Heart - normal rate, regular rhythm, normal S1, S2, no murmurs, rubs, clicks or gallops Abdomen - soft, ttp in epigastric region and right upper abdomen, no gaurding or rebound tenderness, nondistended, no masses or organomegaly Neurological - alert, oriented, normal speech, no focal findings or movement disorder noted Extremities - peripheral pulses normal, no pedal edema, no clubbing or cyanosis Skin - normal coloration and turgor, no rashes  ED Course  Procedures (including critical care time) Labs Review Labs Reviewed  URINALYSIS, ROUTINE W REFLEX MICROSCOPIC (NOT AT [facility]) - Abnormal; Notable for the following:    Color, Urine AMBER (*)    APPearance CLOUDY (*)  Specific Gravity, Urine 1.031 (*)    Bilirubin Urine MODERATE (*)    Ketones, ur 15 (*)    Leukocytes, UA SMALL (*)    All other components within normal limits  URINE MICROSCOPIC-ADD ON - Abnormal; Notable for the following:    Squamous Epithelial / LPF MANY (*)    Bacteria, UA MANY (*)    All other components within normal limits  LIPASE, BLOOD  COMPREHENSIVE METABOLIC PANEL  CBC  POC URINE PREG, ED    Imaging Review US Abdomen Limited  01/17/2015  CLINICAL DATA:  Right upper quadrant pain EXAM: US ABDOMEN LIMITED - RIGHT UPPER QUADRANT COMPARISON:  None.  FINDINGS: Gallbladder: No gallstones or wall thickening visualized. No sonographic Murphy sign noted. Common bile duct: Diameter: 2.3 mm Liver: No focal lesion identified. Within normal limits in parenchymal echogenicity. IMPRESSION: Normal right upper quadrant ultrasound. Electronically Signed   By: Kathreen Devoid   On: 01/17/2015 13:14   I have personally reviewed and evaluated these images and lab results as part of my medical decision-making.   EKG Interpretation None      MDM   Final diagnoses:  Right upper quadrant pain  Epigastric pain    Pt presenting with epigastric and right upper abdominal pain, labs are reassuring, RUQ ultrasound obtained and shows no acute findings.  Pt treated with IV pain and nausea meds.  Pt discharged to arrange for close f/u with her PMD.  Discharged with strict return precautions.  Pt agreeable with plan.    Alfonzo Beers, MD 01/17/15 [phone]

## 2015-01-17 NOTE — Discharge Instructions (Signed)
Return to the ED with any concerns including vomiting and not able to keep down liquids, worsening abdominal pain, fever/chills, decreased level of alertness/lethargy, or any other alarming symptoms  The ultrasound performed today to evaluate your liver and gallbladder was normal.

## 2015-01-17 NOTE — ED Notes (Signed)
Pt c/o abd pain onset last week, pt seen at PCP office last week for symptoms, pt scheduled for [place] to r/o gallbladder, pt hx of reflux, pt c/o nausea, denies v/d, A&O x4

## 2015-01-18 ENCOUNTER — Other Ambulatory Visit: Payer: Self-pay

## 2015-01-23 ENCOUNTER — Other Ambulatory Visit: Payer: Self-pay | Admitting: Family Medicine

## 2015-01-23 ENCOUNTER — Other Ambulatory Visit (HOSPITAL_COMMUNITY): Payer: Self-pay | Admitting: Gastroenterology

## 2015-01-23 DIAGNOSIS — K921 Melena: Secondary | ICD-10-CM

## 2015-01-23 DIAGNOSIS — R1011 Right upper quadrant pain: Secondary | ICD-10-CM

## 2015-01-23 DIAGNOSIS — R109 Unspecified abdominal pain: Secondary | ICD-10-CM

## 2015-01-29 ENCOUNTER — Ambulatory Visit
Admission: RE | Admit: 2015-01-29 | Discharge: 2015-01-29 | Disposition: A | Discharge status: Home / Self Care | Payer: Medicaid Other | Source: Ambulatory Visit | Attending: Family Medicine | Admitting: Family Medicine

## 2015-01-29 DIAGNOSIS — K921 Melena: Secondary | ICD-10-CM

## 2015-01-29 DIAGNOSIS — R109 Unspecified abdominal pain: Secondary | ICD-10-CM

## 2015-01-29 MED ORDER — IOPAMIDOL (ISOVUE-300) INJECTION 61%
100.0000 mL | Freq: Once | INTRAVENOUS | Status: AC | PRN
  Administered 2015-01-29: 100 mL via INTRAVENOUS

## 2015-02-13 ENCOUNTER — Ambulatory Visit (HOSPITAL_COMMUNITY)
Admission: RE | Admit: 2015-02-13 | Discharge: 2015-02-13 | Disposition: A | Discharge status: Home / Self Care | Payer: Medicaid Other | Source: Ambulatory Visit | Attending: Gastroenterology | Admitting: Gastroenterology

## 2015-02-13 DIAGNOSIS — R1011 Right upper quadrant pain: Secondary | ICD-10-CM | POA: Diagnosis not present

## 2015-02-13 MED ORDER — TECHNETIUM TC 99M MEBROFENIN IV KIT
5.3000 | PACK | Freq: Once | INTRAVENOUS | Status: AC | PRN
  Administered 2015-02-13: 5.3 via INTRAVENOUS

## 2015-02-13 MED ORDER — SINCALIDE 5 MCG IJ SOLR
1.8000 ug | Freq: Once | INTRAMUSCULAR | Status: AC
  Administered 2015-02-13: 1.8 ug via INTRAVENOUS

## 2016-07-28 ENCOUNTER — Emergency Department (HOSPITAL_COMMUNITY): Payer: Medicaid Other

## 2016-07-28 ENCOUNTER — Encounter (HOSPITAL_COMMUNITY): Payer: Self-pay | Admitting: Emergency Medicine

## 2016-07-28 ENCOUNTER — Emergency Department (HOSPITAL_COMMUNITY)
Admission: EM | Admit: 2016-07-28 | Discharge: 2016-07-28 | Disposition: A | Discharge status: Home / Self Care | Payer: Medicaid Other | Attending: Emergency Medicine | Admitting: Emergency Medicine

## 2016-07-28 DIAGNOSIS — R079 Chest pain, unspecified: Secondary | ICD-10-CM | POA: Diagnosis present

## 2016-07-28 DIAGNOSIS — R51 Headache: Secondary | ICD-10-CM | POA: Diagnosis not present

## 2016-07-28 DIAGNOSIS — R0789 Other chest pain: Secondary | ICD-10-CM | POA: Insufficient documentation

## 2016-07-28 DIAGNOSIS — F17210 Nicotine dependence, cigarettes, uncomplicated: Secondary | ICD-10-CM | POA: Insufficient documentation

## 2016-07-28 DIAGNOSIS — R519 Headache, unspecified: Secondary | ICD-10-CM

## 2016-07-28 LAB — D-DIMER, QUANTITATIVE (NOT AT ARMC): D DIMER QUANT: 0.54 ug{FEU}/mL — AB (ref 0.00–0.50)

## 2016-07-28 LAB — COMPREHENSIVE METABOLIC PANEL
ALBUMIN: 3.9 g/dL (ref 3.5–5.0)
ALT: 17 U/L (ref 14–54)
ANION GAP: 7 (ref 5–15)
AST: 18 U/L (ref 15–41)
Alkaline Phosphatase: 59 U/L (ref 38–126)
BILIRUBIN TOTAL: 0.7 mg/dL (ref 0.3–1.2)
BUN: 7 mg/dL (ref 6–20)
CO2: 23 mmol/L (ref 22–32)
Calcium: 8.9 mg/dL (ref 8.9–10.3)
Chloride: 107 mmol/L (ref 101–111)
Creatinine, Ser: 0.65 mg/dL (ref 0.44–1.00)
GFR calc non Af Amer: >60 mL/min (ref >60)
GLUCOSE: 81 mg/dL (ref 65–99)
POTASSIUM: 3.8 mmol/L (ref 3.5–5.1)
SODIUM: 137 mmol/L (ref 135–145)
TOTAL PROTEIN: 6.3 g/dL — AB (ref 6.5–8.1)

## 2016-07-28 LAB — I-STAT TROPONIN, ED
TROPONIN I, POC: 0 ng/mL (ref 0.00–0.08)
Troponin i, poc: 0 ng/mL (ref 0.00–0.08)

## 2016-07-28 LAB — CBC WITH DIFFERENTIAL/PLATELET
BASOS ABS: 0 10*3/uL (ref 0.0–0.1)
Basophils Relative: 0 %
Eosinophils Absolute: 0.2 10*3/uL (ref 0.0–0.7)
Eosinophils Relative: 3 %
HEMATOCRIT: 38 % (ref 36.0–46.0)
HEMOGLOBIN: 12.4 g/dL (ref 12.0–15.0)
LYMPHS PCT: 42 %
Lymphs Abs: 2.8 10*3/uL (ref 0.7–4.0)
MCH: 32.3 pg (ref 26.0–34.0)
MCHC: 32.6 g/dL (ref 30.0–36.0)
MCV: 99 fL (ref 78.0–100.0)
MONO ABS: 0.3 10*3/uL (ref 0.1–1.0)
Monocytes Relative: 5 %
NEUTROS ABS: 3.4 10*3/uL (ref 1.7–7.7)
NEUTROS PCT: 50 %
Platelets: 215 10*3/uL (ref 150–400)
RBC: 3.84 MIL/uL — ABNORMAL LOW (ref 3.87–5.11)
RDW: 13.1 % (ref 11.5–15.5)
WBC: 6.7 10*3/uL (ref 4.0–10.5)

## 2016-07-28 MED ORDER — IOPAMIDOL (ISOVUE-370) INJECTION 76%
INTRAVENOUS | Status: AC
  Administered 2016-07-28: 100 mL
  Filled 2016-07-28: qty 100

## 2016-07-28 NOTE — ED Notes (Signed)
Patient transported to CT 

## 2016-07-28 NOTE — ED Notes (Signed)
Josh, NT at the bedside attempting to draw pt blood.

## 2016-07-28 NOTE — ED Notes (Signed)
Pt returned from CT °

## 2016-07-28 NOTE — ED Provider Notes (Signed)
[facility] DEPT Provider Note   CSN: [id number] Arrival date & time: 07/28/16  1142     History   Chief Complaint Chief Complaint  Patient presents with  . Chest Pain  . Headache    HPI Crystal Lester is a 36 y.o. female.  The history is provided by the patient. No language interpreter was used.  Chest Pain   Associated symptoms include headaches.  Headache     Crystal Lester is a 36 y.o. female who presents to the Emergency Department complaining of CP/HA.  She presents for evaluation of chest pain and headache that began last night. She states that she developed some central and left-sided chest pressure and discomfort with associated left-sided headache. With the symptoms she has had tingling in her lips, bilateral arms, bilateral legs. She feels off balance and like she cannot focus, almost near syncopal. She has associated shortness of breath. Her pain is constant in her chest and head but has overall improved since yesterday. No fevers, cough, vomiting. She has chronic right upper quadrant abdominal pain, unchanged from baseline. She reports 2 days of bilateral ankle swelling that is improving today. She has no medical problems. She has a family history of heart disease in her father in his 21s. Past Medical History:  Diagnosis Date  . Hypoglycemia     There are no active problems to display for this patient.   History reviewed. No pertinent surgical history.  OB History    No data available       Home Medications    Prior to Admission medications   Medication Sig Start Date End Date Taking? Authorizing Provider  naproxen sodium (ALEVE) 220 MG tablet Take 220 mg by mouth 2 (two) times daily as needed (pain/headache).   Yes [provider]  ranitidine (ZANTAC) 150 MG tablet Take 1 tablet (150 mg total) by mouth 2 (two) times daily. Patient not taking: Reported on 07/28/2016 01/17/15   Alfonzo Beers, MD  traMADol (ULTRAM) 50 MG tablet Take 1 tablet (50  mg total) by mouth every 6 (six) hours as needed. Patient not taking: Reported on 01/17/2015 11/17/13   Cleatrice Burke, PA-C    Family History Family History  Problem Relation Age of Onset  . Cancer Other     Social History Social History  Substance Use Topics  . Smoking status: Current Every Day Smoker    Packs/day: 0.50    Types: Cigarettes  . Smokeless tobacco: Not on file  . Alcohol use Yes     Comment: social     Allergies   Iodine and Other   Review of Systems Review of Systems  Cardiovascular: Positive for chest pain.  Neurological: Positive for headaches.  All other systems reviewed and are negative.    Physical Exam Updated Vital Signs BP (!) 94/44   Pulse (!) 53   Temp 98.4 F (36.9 C) (Oral)   Resp 18   LMP 07/14/2016   SpO2 97%   Physical Exam  Constitutional: She is oriented to person, place, and time. She appears well-developed and well-nourished.  HENT:  Head: Normocephalic and atraumatic.  Cardiovascular: Normal rate and regular rhythm.   No murmur heard. Pulmonary/Chest: Effort normal and breath sounds normal. No respiratory distress.  Abdominal: Soft. There is no tenderness. There is no rebound and no guarding.  Musculoskeletal: She exhibits no edema or tenderness.  Neurological: She is alert and oriented to person, place, and time. No cranial nerve deficit or sensory deficit. Coordination  normal.  Skin: Skin is warm and dry.  Psychiatric: She has a normal mood and affect. Her behavior is normal.  Nursing note and vitals reviewed.    ED Treatments / Results  Labs (all labs ordered are listed, but only abnormal results are displayed) Labs Reviewed  COMPREHENSIVE METABOLIC PANEL - Abnormal; Notable for the following:       Result Value   Total Protein 6.3 (*)    All other components within normal limits  CBC WITH DIFFERENTIAL/PLATELET - Abnormal; Notable for the following:    RBC 3.84 (*)    All other components within normal limits   D-DIMER, QUANTITATIVE (NOT AT [facility]) - Abnormal; Notable for the following:    D-Dimer, Quant 0.54 (*)    All other components within normal limits  I-STAT TROPOININ, ED  I-STAT TROPOININ, ED    EKG  EKG Interpretation  Date/Time:  Monday Jul 28 2016 11:50:21 EDT Ventricular Rate:  69 PR Interval:  146 QRS Duration: 94 QT Interval:  404 QTC Calculation: 432 R Axis:   40 Text Interpretation:  Normal sinus rhythm with sinus arrhythmia Normal ECG Confirmed by Hazle Coca [phone]) on 07/28/2016 4:13:44 PM       Radiology Dg Chest 2 View  Result Date: 07/28/2016 CLINICAL DATA:  Chest pressure.  Dizziness. EXAM: CHEST  2 VIEW COMPARISON:  11/17/2013. FINDINGS: The heart size and mediastinal contours are within normal limits. Both lungs are clear. The visualized skeletal structures are unremarkable. IMPRESSION: No active cardiopulmonary disease.  Stable exam. Electronically Signed   By: Staci Righter M.D.   On: 07/28/2016 12:58   Ct Head Wo Contrast  Result Date: 07/28/2016 CLINICAL DATA:  Headaches and blurry vision EXAM: CT HEAD WITHOUT CONTRAST TECHNIQUE: Contiguous axial images were obtained from the base of the skull through the vertex without intravenous contrast. COMPARISON:  12/07/2004 FINDINGS: Brain: No evidence of acute infarction, hemorrhage, hydrocephalus, extra-axial collection or mass lesion/mass effect. Vascular: No hyperdense vessel or unexpected calcification. Skull: Normal. Negative for fracture or focal lesion. Sinuses/Orbits: No acute finding. Other: None. IMPRESSION: No acute intracranial abnormality noted. Electronically Signed   By: Inez Catalina M.D.   On: 07/28/2016 17:49   Ct Angio Chest Pe W/cm &/or Wo Cm  Result Date: 07/28/2016 CLINICAL DATA:  Acute onset of generalized chest pain. Initial encounter. EXAM: CT ANGIOGRAPHY CHEST WITH CONTRAST TECHNIQUE: Multidetector CT imaging of the chest was performed using the standard protocol during bolus administration of  intravenous contrast. Multiplanar CT image reconstructions and MIPs were obtained to evaluate the vascular anatomy. CONTRAST:  100 mL of Isovue 370 IV contrast COMPARISON:  Chest radiograph performed earlier today at 12:43 p.m. FINDINGS: Cardiovascular:  There is no evidence of pulmonary embolus. The heart is normal in size. The thoracic aorta is unremarkable. The great vessels are grossly unremarkable in appearance, though difficult to fully assess due to the adjacent contrast bolus. Mediastinum/Nodes: The mediastinum is unremarkable in appearance. No mediastinal lymphadenopathy is seen. No pericardial effusion is identified. The visualized portions of the thyroid gland are unremarkable. No axillary lymphadenopathy is appreciated. Lungs/Pleura: The lungs are clear bilaterally. No focal consolidation, pleural effusion or pneumothorax is seen. No masses are identified. Upper Abdomen: The visualized portions of the liver and spleen are unremarkable. Musculoskeletal: No acute osseous abnormalities are identified. The visualized musculature is unremarkable in appearance. Review of the MIP images confirms the above findings. IMPRESSION: 1. No evidence of pulmonary embolus. 2. Lungs clear bilaterally. Electronically Signed   By: Jacqulynn Cadet  Chang M.D.   On: 07/28/2016 20:26    Procedures Procedures (including critical care time)  Medications Ordered in ED Medications  iopamidol (ISOVUE-370) 76 % injection (100 mLs  Contrast Given 07/28/16 2003)     Initial Impression / Assessment and Plan / ED Course  I have reviewed the triage vital signs and the nursing notes.  Pertinent labs & imaging results that were available during my care of the patient were reviewed by me and considered in my medical decision making (see chart for details).     Patient here for evaluation of headache, chest pain, body paresthesias. She has no focal deficits on neurologic examination and she is in no distress and department.  Presentation is not consistent with CVA, subarachnoid hemorrhage, ACS, dissection. D-dimer mildly elevated and the CTA obtained that was negative for acute PE. Consultation on home care for atypical chest pain as well as headache. Discussed outpatient follow up and precautions.  Final Clinical Impressions(s) / ED Diagnoses   Final diagnoses:  Atypical chest pain  Bad headache    New Prescriptions Discharge Medication List as of 07/28/2016  8:45 PM       Quintella Reichert, MD 07/29/16 [phone]

## 2016-07-28 NOTE — ED Notes (Signed)
Unable to obtain enough blood from lab draw to fill blue top. Will attempt to obtain more blood from pt. If unable will call phlebotomy.

## 2016-07-28 NOTE — ED Triage Notes (Signed)
Pt c/o left sided chest pressure and HA x 2 days with some blurred vision

## 2016-07-28 NOTE — ED Notes (Signed)
Per mini lab, difficulty running I-stat troponin, not enough blood in tube. Will redraw.

## 2019-05-01 ENCOUNTER — Other Ambulatory Visit: Payer: Self-pay

## 2019-05-01 ENCOUNTER — Emergency Department (HOSPITAL_COMMUNITY): Payer: Medicaid Other

## 2019-05-01 ENCOUNTER — Encounter (HOSPITAL_COMMUNITY): Payer: Self-pay | Admitting: Emergency Medicine

## 2019-05-01 ENCOUNTER — Emergency Department (HOSPITAL_COMMUNITY)
Admission: EM | Admit: 2019-05-01 | Discharge: 2019-05-01 | Disposition: A | Discharge status: Home / Self Care | Payer: Medicaid Other | Attending: Emergency Medicine | Admitting: Emergency Medicine

## 2019-05-01 DIAGNOSIS — Z7982 Long term (current) use of aspirin: Secondary | ICD-10-CM | POA: Insufficient documentation

## 2019-05-01 DIAGNOSIS — Z79899 Other long term (current) drug therapy: Secondary | ICD-10-CM | POA: Insufficient documentation

## 2019-05-01 DIAGNOSIS — R072 Precordial pain: Secondary | ICD-10-CM | POA: Insufficient documentation

## 2019-05-01 DIAGNOSIS — Z20822 Contact with and (suspected) exposure to covid-19: Secondary | ICD-10-CM | POA: Insufficient documentation

## 2019-05-01 DIAGNOSIS — R079 Chest pain, unspecified: Secondary | ICD-10-CM

## 2019-05-01 DIAGNOSIS — F17210 Nicotine dependence, cigarettes, uncomplicated: Secondary | ICD-10-CM | POA: Insufficient documentation

## 2019-05-01 LAB — CBC
HCT: 41.5 % (ref 36.0–46.0)
Hemoglobin: 13.6 g/dL (ref 12.0–15.0)
MCH: 32.2 pg (ref 26.0–34.0)
MCHC: 32.8 g/dL (ref 30.0–36.0)
MCV: 98.1 fL (ref 80.0–100.0)
Platelets: 306 10*3/uL (ref 150–400)
RBC: 4.23 MIL/uL (ref 3.87–5.11)
RDW: 12.9 % (ref 11.5–15.5)
WBC: 6.9 10*3/uL (ref 4.0–10.5)
nRBC: 0 % (ref 0.0–0.2)

## 2019-05-01 LAB — SARS CORONAVIRUS 2 (TAT 6-24 HRS): SARS Coronavirus 2: NEGATIVE

## 2019-05-01 LAB — BASIC METABOLIC PANEL
Anion gap: 12 (ref 5–15)
BUN: 9 mg/dL (ref 6–20)
CO2: 23 mmol/L (ref 22–32)
Calcium: 9.2 mg/dL (ref 8.9–10.3)
Chloride: 106 mmol/L (ref 98–111)
Creatinine, Ser: 0.67 mg/dL (ref 0.44–1.00)
GFR calc Af Amer: >60 mL/min (ref >60)
GFR calc non Af Amer: >60 mL/min (ref >60)
Glucose, Bld: 96 mg/dL (ref 70–99)
Potassium: 4.2 mmol/L (ref 3.5–5.1)
Sodium: 141 mmol/L (ref 135–145)

## 2019-05-01 LAB — TROPONIN I (HIGH SENSITIVITY)
Troponin I (High Sensitivity): <2 ng/L (ref <18)
Troponin I (High Sensitivity): <2 ng/L (ref <18)

## 2019-05-01 LAB — I-STAT BETA HCG BLOOD, ED (MC, WL, AP ONLY): I-stat hCG, quantitative: <5 m[IU]/mL (ref <5)

## 2019-05-01 MED ORDER — SODIUM CHLORIDE 0.9% FLUSH
3.0000 mL | Freq: Once | INTRAVENOUS | Status: AC
  Administered 2019-05-01: 18:00:00 3 mL via INTRAVENOUS

## 2019-05-01 MED ORDER — ALUM & MAG HYDROXIDE-SIMETH 200-200-20 MG/5ML PO SUSP
30.0000 mL | Freq: Once | ORAL | Status: AC
  Administered 2019-05-01: 18:00:00 30 mL via ORAL
  Filled 2019-05-01: qty 30

## 2019-05-01 MED ORDER — ACETAMINOPHEN 500 MG PO TABS
1000.0000 mg | ORAL_TABLET | Freq: Once | ORAL | Status: AC
  Administered 2019-05-01: 1000 mg via ORAL
  Filled 2019-05-01: qty 2

## 2019-05-01 MED ORDER — SODIUM CHLORIDE 0.9 % IV BOLUS
500.0000 mL | Freq: Once | INTRAVENOUS | Status: AC
  Administered 2019-05-01: 18:00:00 500 mL via INTRAVENOUS

## 2019-05-01 NOTE — ED Provider Notes (Signed)
[facility] EMERGENCY DEPARTMENT Provider Note   CSN: [id number] Arrival date & time: 05/01/19  1515     History Chief Complaint  Patient presents with  . Chest Pain    Crystal Lester is a 39 y.o. female.  The history is provided by the patient.  Chest Pain Pain location:  Substernal area Pain quality: pressure   Pain radiates to:  Upper back Pain severity:  Moderate Onset quality:  Gradual Duration: onset overnight. Timing:  Constant Progression:  Unchanged Chronicity:  New Context: breathing and at rest   Context: not eating, not movement and not trauma   Relieved by:  Nothing Worsened by:  Deep breathing Ineffective treatments: omeprazole, asa. Associated symptoms: nausea, palpitations and shortness of breath   Associated symptoms: no abdominal pain, no cough, no fever, no lower extremity edema and no vomiting   Risk factors: obesity   Risk factors: no birth control, no coronary artery disease, no diabetes mellitus, no Ehlers-Danlos syndrome, no high cholesterol, no hypertension, no immobilization, not female, no Marfan's syndrome, not pregnant, no prior DVT/PE, no smoking and no surgery        Past Medical History:  Diagnosis Date  . Hypoglycemia     There are no problems to display for this patient.   No past surgical history on file.   OB History   No obstetric history on file.     Family History  Problem Relation Age of Onset  . Cancer Other     Social History   Tobacco Use  . Smoking status: Current Every Day Smoker    Packs/day: 0.50    Types: Cigarettes  Substance Use Topics  . Alcohol use: Yes    Comment: social  . Drug use: No    Home Medications Prior to Admission medications   Medication Sig Start Date End Date Taking? Authorizing Provider  aspirin 325 MG EC tablet Take 325 mg by mouth as needed for pain.   Yes [provider]  omeprazole (PRILOSEC) 20 MG capsule Take 20 mg by mouth daily.   Yes  [provider]  sertraline (ZOLOFT) 50 MG tablet Take 50 mg by mouth daily.   Yes [provider]  ranitidine (ZANTAC) 150 MG tablet Take 1 tablet (150 mg total) by mouth 2 (two) times daily. Patient not taking: Reported on 07/28/2016 01/17/15   Pixie Casino, MD  traMADol (ULTRAM) 50 MG tablet Take 1 tablet (50 mg total) by mouth every 6 (six) hours as needed. Patient not taking: Reported on 01/17/2015 11/17/13   Cleatrice Burke, PA-C    Allergies    Iodine and Other  Review of Systems   Review of Systems  Constitutional: Negative for fever.  Respiratory: Positive for shortness of breath. Negative for cough.   Cardiovascular: Positive for chest pain and palpitations.  Gastrointestinal: Positive for nausea. Negative for abdominal pain and vomiting.  Neurological: Positive for light-headedness.  All other systems reviewed and are negative.   Physical Exam Updated Vital Signs BP (!) 96/54   Pulse 64   Temp 98.2 F (36.8 C) (Oral)   Resp 16   Ht 5\' 8"  (1.727 m)   Wt 113.4 kg   LMP 03/31/2019 (Approximate)   SpO2 100%   BMI 38.01 kg/m   Physical Exam Vitals and nursing note reviewed.  Constitutional:      Appearance: She is well-developed. She is obese. She is not toxic-appearing or diaphoretic.  HENT:     Head: Normocephalic  and atraumatic.  Eyes:     Conjunctiva/sclera: Conjunctivae normal.  Cardiovascular:     Rate and Rhythm: Normal rate and regular rhythm.     Pulses:          Radial pulses are 2+ on the right side and 2+ on the left side.       Dorsalis pedis pulses are 2+ on the right side and 2+ on the left side.     Heart sounds: No murmur.  Pulmonary:     Effort: Pulmonary effort is normal. No respiratory distress.     Breath sounds: Normal breath sounds.  Chest:     Chest wall: No tenderness.  Abdominal:     Palpations: Abdomen is soft.     Tenderness: There is no abdominal tenderness. There is no guarding or rebound.  Musculoskeletal:       Cervical back: Neck supple.     Right lower leg: No edema.     Left lower leg: No edema.  Skin:    General: Skin is warm and dry.  Neurological:     Mental Status: She is alert.     Motor: No weakness.     Comments: No sensory deficit  Psychiatric:        Mood and Affect: Mood is anxious.        Behavior: Behavior normal.     ED Results / Procedures / Treatments   Labs (all labs ordered are listed, but only abnormal results are displayed) Labs Reviewed  SARS CORONAVIRUS 2 (TAT 6-24 HRS)  BASIC METABOLIC PANEL  CBC  I-STAT BETA HCG BLOOD, ED ([facility], [facility], [facility] ONLY)  TROPONIN I (HIGH SENSITIVITY)  TROPONIN I (HIGH SENSITIVITY)    EKG EKG Interpretation  Date/Time:  Sunday May 01 2019 15:20:50 EST Ventricular Rate:  82 PR Interval:  134 QRS Duration: 82 QT Interval:  352 QTC Calculation: 411 R Axis:   36 Text Interpretation: Normal sinus rhythm with sinus arrhythmia Normal ECG Confirmed by Ripley Fraise [phone]) on 05/02/2019 8:10:30 AM   Radiology DG Chest 2 View  Result Date: 05/01/2019 CLINICAL DATA:  Chest pain EXAM: CHEST - 2 VIEW COMPARISON:  05/21/20218 FINDINGS: Lungs are clear.  No pleural effusion or pneumothorax. The heart is normal in size. Visualized osseous structures are within normal limits. IMPRESSION: Normal chest radiographs. Electronically Signed   By: Julian Hy M.D.   On: 05/01/2019 15:39    Procedures Procedures (including critical care time)  Medications Ordered in ED Medications  sodium chloride flush (NS) 0.9 % injection 3 mL (3 mLs Intravenous Given 05/01/19 1745)  alum & mag hydroxide-simeth (MAALOX/MYLANTA) 200-200-20 MG/5ML suspension 30 mL (30 mLs Oral Given 05/01/19 1742)  acetaminophen (TYLENOL) tablet 1,000 mg (1,000 mg Oral Given 05/01/19 1742)  sodium chloride 0.9 % bolus 500 mL (0 mLs Intravenous Stopped 05/01/19 1825)    ED Course  I have reviewed the triage vital signs and the nursing notes.  Pertinent labs &  imaging results that were available during my care of the patient were reviewed by me and considered in my medical decision making (see chart for details).    MDM Rules/Calculators/A&P                      Patient here for chest pressure.  Heart score of 1, troponin undetectably low x2, do not suspect ACS at this time.  PERC negative, do not suspect PE.  No pulse deficits, no tearing or ripping chest pain,  no neurological deficits, do not suspect dissection at this time.  Chest x-ray without evidence of focal pneumonia, pneumothorax, rib fractures.  Patient feeling better at this time after treatment as above, strict return precautions provided, will follow up with primary care provider.  Discharged in stable condition.   Final Clinical Impression(s) / ED Diagnoses Final diagnoses:  Chest pain, unspecified type    Rx / DC Orders ED Discharge Orders    None       Neftali Abair, [place], MD 05/02/19 1433    Elnora Morrison, MD 05/02/19 [phone]

## 2019-05-01 NOTE — ED Triage Notes (Signed)
Pt here for evaluation of central chest heaviness with radiation to back that started at 0300 this morning. Pt's breathing was very shallow, then she got up to use the bathroom and felt dizzy, like she would pass out. Pt did not pass out but felt like her "blood was boiling." Pt took one baby asprin and went to sleep. Pain and shob continue today, worse with movement and exertion.

## 2019-05-01 NOTE — ED Notes (Signed)
Patient verbalizes understanding of discharge instructions. Opportunity for questioning and answers were provided. Armband removed by staff, pt discharged from ED ambulatory to home.  

## 2019-05-27 ENCOUNTER — Encounter: Payer: Self-pay | Admitting: Cardiology

## 2019-06-09 ENCOUNTER — Encounter: Payer: Self-pay | Admitting: General Practice

## 2019-06-20 ENCOUNTER — Encounter: Payer: Self-pay | Admitting: Cardiology

## 2019-06-20 ENCOUNTER — Other Ambulatory Visit: Payer: Self-pay

## 2019-06-20 ENCOUNTER — Ambulatory Visit: Payer: Medicaid Other | Admitting: Cardiology

## 2019-06-20 ENCOUNTER — Encounter: Payer: Self-pay | Admitting: *Deleted

## 2019-06-20 VITALS — BP 124/8 | HR 68 | Temp 97.3°F | Ht 69.0 in | Wt 327.0 lb

## 2019-06-20 DIAGNOSIS — R0789 Other chest pain: Secondary | ICD-10-CM | POA: Insufficient documentation

## 2019-06-20 DIAGNOSIS — R002 Palpitations: Secondary | ICD-10-CM

## 2019-06-20 DIAGNOSIS — R079 Chest pain, unspecified: Secondary | ICD-10-CM

## 2019-06-20 HISTORY — DX: Chest pain, unspecified: R07.9

## 2019-06-20 HISTORY — DX: Other chest pain: R07.89

## 2019-06-20 HISTORY — DX: Palpitations: R00.2

## 2019-06-20 NOTE — Progress Notes (Signed)
Primary Care Provider: Patient, No Pcp Per Cardiologist: Glenetta Hew, MD Electrophysiologist: None  Clinic Note: Chief Complaint  Patient presents with  . New Admit To SNF    Chest pain  . Chest Pain    Woke up with severe chest pain, went to ER on May 01, 2019  . Palpitations    At least to 3 months of worsening palpitations.   HPI:    Crystal Lester is a 39 y.o. morbidly obese female with a history of severe depression and anxiety who is being seen today for the evaluation of CHEST PAIN and PALPITATIONS at the request of Caren Macadam, MD.  Recent Hospitalizations:   May 27, 2019-ER visit for chest pain (described below).  Awakened from sleep with severe substernal chest pain.  Ruled out for MI.  Considered to be moderate risk for PE although D-dimer not checked.  Discharged home.  Crystal Lester was seen in clinic follow-up by her PCP on March 19 after this ER visit.  She had noted about 2 weeks of palpitations leading up to the episode, but that morning she woke up from sleep with heavy weight (like an elephant sitting on her chest that made her difficult to breathe.  With the pain radiating toward her back and she is profoundly dyspneic to the point of feeling nausea and dizziness.  She follows her she was passed out.  She was able to go to the bathroom and had an unusual sensation where it felt like the veins throughout her body were burning.  She did take an aspirin with some relief.   All told the episode lasted an hour.Went to the ER once he stabilized.  She had his mother drive her because she cannot drive.  In the ER she was given Mylanta and sent home.  --> Has had less prominent symptoms since then as well as nausea and dizziness with some racing heart rates..   Reviewed  CV studies:    The following studies were reviewed today: (if available, images/films reviewed: From Epic Chart or Care Everywhere) . None   Interval History:   Crystal Lester is here  today to discuss her chest pain and palpitations.  Essentially, she tells me that she has been having spells of off-and-on racing heart rates where she will feel pound pound pound than a jerk and then things go away.  Usually they resolve with a cough.  They lasts maybe a few minutes and happen may be couple times a day or once every 2 days.  She does get dizzy and lightheaded with the spells.  The spells of it happening a while now, made about a year.  These palpitation episodes are very different than what she woke up with on March 19.  She described to me almost identical to what the prescription description was above that she woke up with severe chest pain lasting about an hour.  Did not get better with any particular treatment.  There was left-sided center chest heaviness like an elephant weight.  She had that symptom of the veins burning.  She was dizzy and nauseated.  She has dry heaving and coughing.  The hot clammy sensation in the veins was near the end of that spell of chest pain and lasted only a few minutes.  Once the episode of chest pain cleared, she felt really tired fatigued overall drained dyspneic for about 24 to 48 hours before she was finally back to normal.  She did  not feel her classic little fluttering sensations irregular heartbeat but did feel her heart racing every now and then during that episode of chest pain.  She has had intermittent episodes of the chest heaviness but not nearly to the extent that that episode occurred.  For the most part the sensation happens when she is lying down to sleep, but can happen when she is breathing hard with some exercise.  At the end of the interview she explained to me that since the episode of chest pain she has a hard time sleeping on her left side, and if she tries to sleep on her back she has the prop her right arm up underneath her back to basically stretch out the chest wall.  This makes the chest discomfort better.  Cardiovascular  Review of Symptoms (Summary): positive for - chest pain, dyspnea on exertion, palpitations, rapid heart rate and Associated nausea weakness and dizziness with chest pain.  Also dizziness with palpitations/rapid heart rates negative for - edema, loss of consciousness, orthopnea, paroxysmal nocturnal dyspnea, shortness of breath or Near syncope, TIA/amaurosis fugax, medication  The patient does not have symptoms concerning for COVID-19 infection (fever, chills, cough, or new shortness of breath).  The patient is practicing social distancing & Masking.    REVIEWED OF SYSTEMS   ROS   I have reviewed and (if needed) personally updated the patient's problem list, medications, allergies, past medical and surgical history, social and family history.   PAST MEDICAL HISTORY   Past Medical History:  Diagnosis Date  . Hypoglycemia     PAST SURGICAL HISTORY   History reviewed. No pertinent surgical history.  MEDICATIONS/ALLERGIES   No outpatient medications have been marked as taking for the 06/20/19 encounter (Office Visit) with Leonie Man, MD.    Allergies  Allergen Reactions  . Iodine     Skin irritation  . Other Nausea And Vomiting    Reaction to honey    SOCIAL HISTORY/FAMILY HISTORY   Social History   Tobacco Use  . Smoking status: Former Smoker    Packs/day: 0.50    Types: Cigarettes    Quit date: 2018    Years since quitting: 3.2  . Smokeless tobacco: Never Used  Substance Use Topics  . Alcohol use: Not Currently    Comment: social  . Drug use: No   Social History   Social History Narrative   Married mother of 4-from first marriage 55, 82, 78 and 38 years old. ->  All the 38 year old with her..   Current husband is disabled-lost to likes to complications of diabetes and CKD/ESRD.   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   Family History  Problem Relation Age of Onset  . Cancer Other   . Hypertension  Mother   . Diabetes Mellitus II Mother   . Heart attack Father 15  . CAD Father   . Hypertension Father   . Colon cancer Father   . Healthy Brother   . Hypertension Paternal Grandmother   . Hypertension Paternal Grandfather   . Stroke Paternal Grandfather     OBJCTIVE -PE, EKG, labs   Wt Readings from Last 3 Encounters:  06/20/19 (!) 327 lb (148.3 kg)  05/01/19 250 lb (113.4 kg)  01/17/15 220 lb (99.8 kg)  The weight from 05/01/2019 is probably not accurate.  Physical Exam: BP (!) 124/8   Pulse 68   Temp (!) 97.3 F (36.3 C)  Ht 5\' 9"  (1.753 m)   Wt (!) 327 lb (148.3 kg)   SpO2 98%   BMI 48.29 kg/m  Physical Exam  Constitutional: She is oriented to person, place, and time. She appears well-developed and well-nourished. No distress.  Morbidly obese.  Pleasant.  Well-groomed.  HENT:  Head: Normocephalic and atraumatic.  Eyes: Pupils are equal, round, and reactive to light. EOM are normal.  Neck: No JVD present.  Cardiovascular: Normal rate, regular rhythm and intact distal pulses.  Pulmonary/Chest: Effort normal and breath sounds normal. No respiratory distress. She has no wheezes. She has no rales.  Somewhat distant breath sounds but clear; She has focal tenderness along the second third and fourth left ribs at the costochondral-sternal junction.  This reproduces her symptoms that she had.  Abdominal: Soft. Bowel sounds are normal. She exhibits no distension. There is no abdominal tenderness. There is no rebound.  Musculoskeletal:        General: No edema. Normal range of motion.     Cervical back: Normal range of motion and neck supple.  Neurological: She is alert and oriented to person, place, and time. No cranial nerve deficit.  Skin: Skin is warm and dry. No erythema.  Psychiatric: She has a normal mood and affect. Her behavior is normal. Judgment and thought content normal.  Somewhat anxious, but seems controlled .  Vitals reviewed.   Adult ECG Report  Rate: 68  ;  Rhythm: normal sinus rhythm and Normal axis, intervals and durations;   Narrative Interpretation: Normal EKG  Recent Labs: May 27, 2019  Na+ 141, K+ 4.1, Cl- 104, HCO3-27, BUN 10, Cr 0.64, Glu 87, Ca2+ 9.4; AST 19, ALT 27, AlkP 93  CBC: W 6.1, H/H 12.9/37.6, Plt 304  TC 215, TG 128, HDL 50, LDL 142; TSH 3.36.  No results found for: CHOL, HDL, LDLCALC, LDLDIRECT, TRIG, CHOLHDL Lab Results  Component Value Date   CREATININE 0.67 05/01/2019   BUN 9 05/01/2019   NA 141 05/01/2019   K 4.2 05/01/2019   CL 106 05/01/2019   CO2 23 05/01/2019   No results found for: TSH  ASSESSMENT/PLAN   Roxan presents with a concerning symptom of waking up with central chest pain or pressure.  However with a 1 hour chest pain and ruling out for MI, it is unlikely that it was cardiac in nature.  Too long for spasm.  My suspicion based on the fact there is reproducibility on exam and with certain movements, that this is probably chest wall pain consistent with costochondritis.  We will exclude ischemia with a GXT, but will also treat for costochondritis with an ibuprofen taper.  Taper dosing of Ibuprofen: Drink plenty of water and eat something  with Ibuprofen doses Take 800 mg   ( 4 tablets) - Three times a day for 2 days, Take 600 mg ( 3 tablets)   Three times a day for 3 days, Take 400 mg ( 2 tablets)    Three times a day for 3 days, Take 200 mg ( 1 tablet)     Three times a day for 3 days then stop.  We will evaluate her palpitation episodes with 14-day Zio patch monitor.  Problem List Items Addressed This Visit    Chest wall discomfort   Relevant Orders   EKG 12-Lead   EXERCISE TOLERANCE TEST (ETT)   LONG TERM MONITOR (3-14 DAYS)   Chest pain with low risk for cardiac etiology - Primary   Relevant Orders  EKG 12-Lead   EXERCISE TOLERANCE TEST (ETT)   LONG TERM MONITOR (3-14 DAYS)   Rapid palpitations   Relevant Orders   EXERCISE TOLERANCE TEST (ETT)   LONG TERM MONITOR (3-14 DAYS)        COVID-19 Education: The signs and symptoms of COVID-19 were discussed with the patient and how to seek care for testing (follow up with PCP or arrange E-visit).   The importance of social distancing was discussed today.  I spent a total of 7minutes with the patient. >  50% of the time was spent in direct patient consultation.  Additional time spent with chart review  / charting (studies, outside notes, etc): 16 Total Time: 44 min   Current medicines are reviewed at length with the patient today.  (+/- concerns) none  Notice: This dictation was prepared with Dragon dictation along with smaller phrase technology. Any transcriptional errors that result from this process are unintentional and may not be corrected upon review.  Patient Instructions / Medication Changes & Studies & Tests Ordered   Patient Instructions  Medication Instructions:  Drink plenty of water and eat something  with Ibuprofen doses  Taper dosing of Ibuprofen  Take 800 mg   ( 4 tablets) - Three times a day for 2 days, Take 600 mg ( 3 tablets)   Three times a day for 3 days, Take 400 mg ( 2 tablets)    Three times a day for 3 days, Take 200 mg ( 1 tablet)     Three times a day for 3 days then stop.    *If you need a refill on your cardiac medications before your next appointment, please call your pharmacy*   Lab Work: Will need to have Covid test 3 days prior to Exercise tolerance test  . Go to [address] -- self isloate after covid test.  If you have labs (blood work) drawn today and your tests are completely normal, you will receive your results only by: Marland Kitchen MyChart Message (if you have MyChart) OR . A paper copy in the mail If you have any lab test that is abnormal or we need to change your treatment, we will call you to review the results.   Testing/Procedures: WILL BE SCHEDULE AT [facility] Your physician has requested that you have an exercise tolerance test. For  further information please visit [email or web address]. Please also follow instruction sheet, as given.  AND   Your physician has recommended that you wear a *14* DAY ZIO-PATCH monitor.      Follow-Up: Your next appointment:   2 month(s)  The format for your next appointment:   In Person  Provider:   Glenetta Hew, MD   Other Instructions Drink plenty of water and eat something  with Ibuprofen doses   Studies Ordered:   Orders Placed This Encounter  Procedures  . EXERCISE TOLERANCE TEST (ETT)  . LONG TERM MONITOR (3-14 DAYS)  . EKG 12-Lead     Glenetta Hew, M.D., M.S. Interventional Cardiologist   Pager # [phone] Phone # [phone] [address] [place], [place] [address]   Thank you for choosing [facility] at [facility]!!

## 2019-06-20 NOTE — Patient Instructions (Addendum)
Medication Instructions:  Drink plenty of water and eat something  with Ibuprofen doses  Taper dosing of Ibuprofen  Take 800 mg   ( 4 tablets) - Three times a day for 2 days, Take 600 mg ( 3 tablets)   Three times a day for 3 days, Take 400 mg ( 2 tablets)    Three times a day for 3 days, Take 200 mg ( 1 tablet)     Three times a day for 3 days then stop.    *If you need a refill on your cardiac medications before your next appointment, please call your pharmacy*   Lab Work: Will need to have Covid test 3 days prior to Exercise tolerance test  . Go to [address] -- self isloate after covid test.  If you have labs (blood work) drawn today and your tests are completely normal, you will receive your results only by: Marland Kitchen MyChart Message (if you have MyChart) OR . A paper copy in the mail If you have any lab test that is abnormal or we need to change your treatment, we will call you to review the results.   Testing/Procedures: WILL BE SCHEDULE AT [place] Your physician has requested that you have an exercise tolerance test. For further information please visit [email or web address]. Please also follow instruction sheet, as given.  AND   Your physician has recommended that you wear a *14* DAY ZIO-PATCH monitor. The Zio patch cardiac monitor continuously records heart rhythm data for up to 14 days, this is for patients being evaluated for multiple types heart rhythms. For the first 24 hours post application, please avoid getting the Zio monitor wet in the shower or by excessive sweating during exercise. After that, feel free to carry on with regular activities. Keep soaps and lotions away from the ZIO XT Patch.  This will be mailed to you, please expect 7-10 days to receive.   [employer] location - [place], [address].        Follow-Up: At [facility], you and your health needs are our priority.  As part of our continuing mission to provide you with  exceptional heart care, we have created designated Provider Care Teams.  These Care Teams include your primary Cardiologist (physician) and Advanced Practice Providers (APPs -  Physician Assistants and Nurse Practitioners) who all work together to provide you with the care you need, when you need it.  We recommend signing up for the patient portal called "MyChart".  Sign up information is provided on this After Visit Summary.  MyChart is used to connect with patients for Virtual Visits (Telemedicine).  Patients are able to view lab/test results, encounter notes, upcoming appointments, etc.  Non-urgent messages can be sent to your provider as well.   To learn more about what you can do with MyChart, go to [email or web address].    Your next appointment:   2 month(s)  The format for your next appointment:   In Person  Provider:   Glenetta Hew, MD   Other Instructions Drink plenty of water and eat something  with Ibuprofen doses   ZIO XT- Long Term Monitor Instructions   Your physician has requested you wear your ZIO patch monitor___14____days.   This is a single patch monitor.  [employer] supplies one patch monitor per enrollment.  Additional stickers are not available.   Please do not apply patch if you will be having a Nuclear Stress Test, Echocardiogram, Cardiac CT, MRI,  or Chest Xray during the time frame you would be wearing the monitor. The patch cannot be worn during these tests.  You cannot remove and re-apply the ZIO XT patch monitor.   Your ZIO patch monitor will be sent USPS Priority mail from [facility] directly to your home address. The monitor may also be mailed to a PO BOX if home delivery is not available.   It may take 3-5 days to receive your monitor after you have been enrolled.   Once you have received you monitor, please review enclosed instructions.  Your monitor has already been registered assigning a specific monitor serial # to you.   Applying the  monitor   Shave hair from upper left chest.   Hold abrader disc by orange tab.  Rub abrader in 40 strokes over left upper chest as indicated in your monitor instructions.   Clean area with 4 enclosed alcohol pads .  Use all pads to assure are is cleaned thoroughly.  Let dry.   Apply patch as indicated in monitor instructions.  Patch will be place under collarbone on left side of chest with arrow pointing upward.   Rub patch adhesive wings for 2 minutes.Remove white label marked "1".  Remove white label marked "2".  Rub patch adhesive wings for 2 additional minutes.   While looking in a mirror, press and release button in center of patch.  A small green light will flash 3-4 times .  This will be your only indicator the monitor has been turned on.     Do not shower for the first 24 hours.  You may shower after the first 24 hours.   Press button if you feel a symptom. You will hear a small click.  Record Date, Time and Symptom in the Patient Log Book.   When you are ready to remove patch, follow instructions on last 2 pages of Patient Log Book.  Stick patch monitor onto last page of Patient Log Book.   Place Patient Log Book in [place] box.  Use locking tab on box and tape box closed securely.  The [employer] has [employer] on it.  Please place in mailbox as soon as possible.  Your physician should have your test results approximately 7 days after the monitor has been mailed back to [facility].   Call [place] at [phone] if you have questions regarding your ZIO XT patch monitor.  Call them immediately if you see an orange light blinking on your monitor.   If your monitor falls off in less than 4 days contact our Monitor department at [phone].  If your monitor becomes loose or falls off after 4 days call [employer] at [phone] for suggestions on securing your monitor.

## 2019-06-20 NOTE — Progress Notes (Signed)
Patient ID: Crystal Lester, female   DOB: Apr 14, 1980, 39 y.o.   MRN: [id number] Patient enrolled for 14 day ZIO XT long term holter monitor to be shipped to her home.

## 2019-06-21 ENCOUNTER — Telehealth: Payer: Self-pay | Admitting: Cardiology

## 2019-06-21 ENCOUNTER — Encounter: Payer: Self-pay | Admitting: Cardiology

## 2019-06-21 NOTE — Telephone Encounter (Signed)
Left message for patient to call and schedule ETT and COVID screening

## 2019-06-22 ENCOUNTER — Encounter: Payer: Self-pay | Admitting: Cardiology

## 2019-06-28 ENCOUNTER — Other Ambulatory Visit (HOSPITAL_COMMUNITY)
Admission: RE | Admit: 2019-06-28 | Discharge: 2019-06-28 | Disposition: A | Discharge status: Home / Self Care | Payer: 59 | Source: Ambulatory Visit | Attending: Cardiology | Admitting: Cardiology

## 2019-06-28 DIAGNOSIS — Z01812 Encounter for preprocedural laboratory examination: Secondary | ICD-10-CM | POA: Diagnosis present

## 2019-06-28 DIAGNOSIS — Z20822 Contact with and (suspected) exposure to covid-19: Secondary | ICD-10-CM | POA: Insufficient documentation

## 2019-06-28 LAB — SARS CORONAVIRUS 2 (TAT 6-24 HRS): SARS Coronavirus 2: NEGATIVE

## 2019-06-29 ENCOUNTER — Telehealth (HOSPITAL_COMMUNITY): Payer: Self-pay

## 2019-06-29 NOTE — Telephone Encounter (Signed)
Encounter complete. 

## 2019-07-01 ENCOUNTER — Ambulatory Visit (HOSPITAL_COMMUNITY)
Admission: RE | Admit: 2019-07-01 | Discharge: 2019-07-01 | Disposition: A | Discharge status: Home / Self Care | Payer: 59 | Source: Ambulatory Visit | Attending: Cardiovascular Disease | Admitting: Cardiovascular Disease

## 2019-07-01 ENCOUNTER — Other Ambulatory Visit: Payer: Self-pay

## 2019-07-01 DIAGNOSIS — R079 Chest pain, unspecified: Secondary | ICD-10-CM | POA: Insufficient documentation

## 2019-07-01 DIAGNOSIS — R0789 Other chest pain: Secondary | ICD-10-CM | POA: Diagnosis not present

## 2019-07-01 DIAGNOSIS — R002 Palpitations: Secondary | ICD-10-CM | POA: Diagnosis not present

## 2019-07-01 LAB — EXERCISE TOLERANCE TEST
Estimated workload: 7.5 METS
Exercise duration (min): 6 min
Exercise duration (sec): 21 s
MPHR: 183 {beats}/min
Peak HR: 166 {beats}/min
Percent HR: 90 %
Rest HR: 68 {beats}/min

## 2019-07-03 ENCOUNTER — Ambulatory Visit (INDEPENDENT_AMBULATORY_CARE_PROVIDER_SITE_OTHER): Payer: 59

## 2019-07-03 DIAGNOSIS — R002 Palpitations: Secondary | ICD-10-CM | POA: Diagnosis not present

## 2019-07-03 DIAGNOSIS — R079 Chest pain, unspecified: Secondary | ICD-10-CM

## 2019-07-03 DIAGNOSIS — R0789 Other chest pain: Secondary | ICD-10-CM

## 2019-08-03 ENCOUNTER — Other Ambulatory Visit: Payer: Self-pay | Admitting: Gastroenterology

## 2019-08-11 ENCOUNTER — Ambulatory Visit: Payer: 59 | Admitting: Cardiology

## 2019-09-09 ENCOUNTER — Inpatient Hospital Stay (HOSPITAL_COMMUNITY): Admission: RE | Admit: 2019-09-09 | Payer: 59 | Source: Ambulatory Visit

## 2019-09-09 NOTE — Progress Notes (Signed)
Pre call done, pt made aware to be at hospital around 10:15am and will need transport home from hospital, also informed to have covid test completed by tomorrow (09-10-19) at [facility], pt to remain npo prior to procedure and take prep as prescribed, questions answered

## 2019-09-11 ENCOUNTER — Encounter (HOSPITAL_COMMUNITY): Payer: Self-pay | Admitting: Certified Registered"

## 2019-09-13 ENCOUNTER — Ambulatory Visit (HOSPITAL_COMMUNITY): Admission: RE | Admit: 2019-09-13 | Payer: 59 | Source: Home / Self Care | Admitting: Gastroenterology

## 2019-09-13 ENCOUNTER — Encounter (HOSPITAL_COMMUNITY): Admission: RE | Payer: Self-pay | Source: Home / Self Care

## 2019-09-13 SURGERY — COLONOSCOPY WITH PROPOFOL
Anesthesia: Monitor Anesthesia Care

## 2019-09-22 ENCOUNTER — Ambulatory Visit (INDEPENDENT_AMBULATORY_CARE_PROVIDER_SITE_OTHER): Payer: 59 | Admitting: Cardiology

## 2019-09-22 ENCOUNTER — Other Ambulatory Visit: Payer: Self-pay

## 2019-09-22 ENCOUNTER — Encounter: Payer: Self-pay | Admitting: Cardiology

## 2019-09-22 DIAGNOSIS — R0789 Other chest pain: Secondary | ICD-10-CM

## 2019-09-22 DIAGNOSIS — R002 Palpitations: Secondary | ICD-10-CM | POA: Diagnosis not present

## 2019-09-22 DIAGNOSIS — R079 Chest pain, unspecified: Secondary | ICD-10-CM | POA: Diagnosis not present

## 2019-09-22 NOTE — Patient Instructions (Addendum)
Medication Instructions:  Not needed *If you need a refill on your cardiac medications before your next appointment, please call your pharmacy*   Lab Work: Not needed    Testing/Procedures: Not needed   Follow-Up: At [facility], you and your health needs are our priority.  As part of our continuing mission to provide you with exceptional heart care, we have created designated Provider Care Teams.  These Care Teams include your primary Cardiologist (physician) and Advanced Practice Providers (APPs -  Physician Assistants and Nurse Practitioners) who all work together to provide you with the care you need, when you need it.  We recommend signing up for the patient portal called "MyChart".  Sign up information is provided on this After Visit Summary.  MyChart is used to connect with patients for Virtual Visits (Telemedicine).  Patients are able to view lab/test results, encounter notes, upcoming appointments, etc.  Non-urgent messages can be sent to your provider as well.   To learn more about what you can do with MyChart, go to [email or web address].    Your next appointment:    as needed  The format for your next appointment:    as needed  Provider:   You may see Glenetta Hew, MD or one of the following Advanced Practice Providers on your designated Care Team:    Rosaria Ferries, PA-C  Jory Sims, DNP, ANP  Cadence Kathlen Mody, PA-C

## 2019-09-22 NOTE — Progress Notes (Signed)
Primary Care Provider: Caren Macadam, MD Cardiologist: Glenetta Hew, MD Electrophysiologist: None  Clinic Note: Chief Complaint  Patient presents with  . Follow-up    Test results  . Chest Pain    Stress test  . Palpitations    Monitor   HPI:    Crystal Lester is a 39 y.o. morbidly obese female with a history of severe depression and anxiety who is being seen today for the follow-up evaluation of CHEST PAIN and PALPITATIONS.  Recent Hospitalizations:  None since February  Crystal Lester was seen on Mulan 12, 2020 for evaluation of chest pain palpitations at the request of Caren Macadam, MD.  Had been to the ER for 2 weeks palpitations leading up to the ER visit.  Woke up with a heavy weight on her chest.  Noted dyspnea nausea etc.  She felt as though she passed out.  Recommended GXT, monitor and short course of ibuprofen taper.  Reviewed  CV studies:    The following studies were reviewed today: (if available, images/films reviewed: From Epic Chart or Care Everywhere) . GXT (07/01/2019): Ambulated 6:21 min; 7.5 METS, peak HR 166 bpm - 90% max corrected.  Stopped because of dyspnea.  No angina.  No ST segment deviation.  Negative test with no EKG evidence of ischemia. . Monitor (May 2021): Overall normal study.  No arrhythmias or bradycardia noted.  Predominantly sinus rhythm-minimum HR 48 bpm, maximum 153 bpm.  Average 74 bpm.  Rare isolated PACs and PAC couplets.  2 very short bursts (4 and 6 beats) PAT 123 and 143 bpm.  Not patient triggered.  5 triggered events during NSR, 2 triggered events with isolated PACs  Interval History:   Crystal Lester is here today to follow-up her studies.  She says that actually the chest discomfort episodes have not been happening very much--the ibuprofen did improve her symptoms.  Much better, and she has used it off and on for some chest pain since.  No prolonged chest heaviness type symptoms. .  Her palpitations have also been doing much  better.  No further near syncopal episodes.   Her energy level is actually improving and she is able to be more active.  She is in much better spirits.   We off-and-on racing heart rates and pounding seems to be improving.  Cardiovascular Review of Symptoms (Summary): positive for - Notably improved palpitations and much less chest discomfort.  Still associated with sleeping on her side negative for - dyspnea on exertion, edema, loss of consciousness, orthopnea, paroxysmal nocturnal dyspnea, rapid heart rate, shortness of breath or No further syncope or near syncope, TIA/amaurosis fugax, medication  The patient DOES NOT have symptoms concerning for COVID-19 infection (fever, chills, cough, or new shortness of breath).  The patient is practicing social distancing & Masking.   Has not yet had Covid vaccine vaccine  REVIEWED OF SYSTEMS   Review of Systems  Constitutional: Negative for malaise/fatigue (Overall her energy is better).  HENT: Negative for congestion.   Respiratory: Negative for shortness of breath.   Gastrointestinal: Negative for blood in stool and melena.  Genitourinary: Negative for hematuria.  Musculoskeletal: Negative for falls and joint pain.  Neurological: Negative for dizziness and loss of consciousness.  Psychiatric/Behavioral: The patient is not nervous/anxious.     I have reviewed and (if needed) personally updated the patient's problem list, medications, allergies, past medical and surgical history, social and family history.   PAST MEDICAL HISTORY   Past Medical History:  Diagnosis Date  . Hypoglycemia     PAST SURGICAL HISTORY   History reviewed. No pertinent surgical history.  MEDICATIONS/ALLERGIES   No outpatient medications have been marked as taking for the 09/22/19 encounter (Office Visit) with Leonie Man, MD.    Allergies  Allergen Reactions  . Iodine     Skin irritation  . Latex Itching    Minor sensitivity  . Other Nausea And  Vomiting    Reaction to honey    SOCIAL HISTORY/FAMILY HISTORY   Social History   Tobacco Use  . Smoking status: Former Smoker    Packs/day: 0.50    Types: Cigarettes    Quit date: 2018    Years since quitting: 3.5  . Smokeless tobacco: Never Used  Substance Use Topics  . Alcohol use: Not Currently    Comment: social  . Drug use: No   Social History   Social History Narrative   Married mother of 4-from first marriage 36, 38, 20 and 38 years old. ->  All the 38 year old with her..   Current husband is disabled-lost to likes to complications of diabetes and CKD/ESRD.   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   Family History  Problem Relation Age of Onset  . Cancer Other   . Hypertension Mother   . Diabetes Mellitus II Mother   . Heart attack Father 37  . CAD Father   . Hypertension Father   . Colon cancer Father   . Healthy Brother   . Hypertension Paternal Grandmother   . Hypertension Paternal Grandfather   . Stroke Paternal Grandfather     OBJCTIVE -PE, EKG, labs   Wt Readings from Last 3 Encounters:  09/22/19 (!) 325 lb (147.4 kg)  06/20/19 (!) 327 lb (148.3 kg)  05/01/19 250 lb (113.4 kg)  The weight from 05/01/2019 is probably not accurate.  Physical Exam: BP 113/78   Pulse 86   Temp (!) 96.8 F (36 C)   Ht 5\' 9"  (1.753 m)   Wt (!) 325 lb (147.4 kg)   SpO2 96%   BMI 47.99 kg/m  Physical Exam Vitals reviewed.  Constitutional:      General: She is not in acute distress.    Appearance: Normal appearance. She is well-developed. She is obese.     Comments: Well-groomed.  Obese but otherwise healthy.  HENT:     Head: Normocephalic and atraumatic.  Neck:     Vascular: No JVD.  Cardiovascular:     Rate and Rhythm: Normal rate and regular rhythm.     Pulses: Normal pulses and intact distal pulses.     Heart sounds: Normal heart sounds. No murmur heard.  No friction rub. No gallop.   Pulmonary:      Effort: Pulmonary effort is normal. No respiratory distress.     Breath sounds: Normal breath sounds. No wheezing or rales.  Chest:     Chest wall: Tenderness (Along the costosternal border) present.  Musculoskeletal:        General: No swelling. Normal range of motion.  Neurological:     General: No focal deficit present.     Mental Status: She is alert and oriented to person, place, and time.  Psychiatric:        Mood and Affect: Mood normal.        Behavior: Behavior normal.        Thought Content: Thought content normal.  Judgment: Judgment normal.     Comments: Somewhat anxious, but seems controlled .     Adult ECG Report n/a  Recent Labs: No new labs since May 27, 2019  Na+ 141, K+ 4.1, Cl- 104, HCO3-27, BUN 10, Cr 0.64, Glu 87, Ca2+ 9.4; AST 19, ALT 27, AlkP 93  CBC: W 6.1, H/H 12.9/37.6, Plt 304  TC 215, TG 128, HDL 50, LDL 142; TSH 3.36.  No results found for: CHOL, HDL, LDLCALC, LDLDIRECT, TRIG, CHOLHDL Lab Results  Component Value Date   CREATININE 0.67 05/01/2019   BUN 9 05/01/2019   NA 141 05/01/2019   K 4.2 05/01/2019   CL 106 05/01/2019   CO2 23 05/01/2019   No results found for: TSH  ASSESSMENT/PLAN   Crystal Lester presents today saying that her symptoms of improved dramatically.  The ibuprofen taper did help her chest pain. Her palpitations have notably improved.  Was happy to hear the results of her studies.  I will return her to the care of her PCP.  She can return on an as-needed basis.  Problem List Items Addressed This Visit    Chest wall discomfort    My clinical diagnosis of her chest pain was that this was costochondritis/chest wall discomfort.  We talked about as needed use of ibuprofen and Tylenol since that is what worked at our last visit.      Chest pain with low risk for cardiac etiology    Chest pain was not exertional with max effort on her treadmill.  This would argue against fixed coronary disease causing her symptoms.  Given  her young age and lack of risk factors, would not further evaluate this time.      Rapid palpitations    Unfortunately, we were not able to capture anything on the monitor.  Most the time she hit the button, she was in sinus rhythm or just PACs.  No signs of any arrhythmias.  She did not necessarily have any major symptoms when she wore it.  We still are not sure what was going on, but seems like relatively benign at this point.  Continue to monitor for now.  Would be reluctant to treat medically as documented arrhythmia.  I did briefly discuss vagal maneuvers if symptoms were to recur.          COVID-19 Education: The signs and symptoms of COVID-19 were discussed with the patient and how to seek care for testing (follow up with PCP or arrange E-visit).   The importance of social distancing was discussed today.  I spent a total of 16 minutes with the patient. >  50% of the time was spent in direct patient consultation.  Additional time spent with chart review  / charting (studies, outside notes, etc): 8 Total Time: 24 min   Current medicines are reviewed at length with the patient today.  (+/- concerns) none  Notice: This dictation was prepared with Dragon dictation along with smaller phrase technology. Any transcriptional errors that result from this process are unintentional and may not be corrected upon review.  Patient Instructions / Medication Changes & Studies & Tests Ordered   Patient Instructions  Medication Instructions:  Not needed *If you need a refill on your cardiac medications before your next appointment, please call your pharmacy*   Lab Work: Not needed    Testing/Procedures: Not needed   Follow-Up: At [facility], you and your health needs are our priority.  As part of our continuing mission to provide you  with exceptional heart care, we have created designated Provider Care Teams.  These Care Teams include your primary Cardiologist (physician) and  Advanced Practice Providers (APPs -  Physician Assistants and Nurse Practitioners) who all work together to provide you with the care you need, when you need it.  We recommend signing up for the patient portal called "MyChart".  Sign up information is provided on this After Visit Summary.  MyChart is used to connect with patients for Virtual Visits (Telemedicine).  Patients are able to view lab/test results, encounter notes, upcoming appointments, etc.  Non-urgent messages can be sent to your provider as well.   To learn more about what you can do with MyChart, go to [email or web address].    Your next appointment:    as needed  The format for your next appointment:    as needed  Provider:   You may see Glenetta Hew, MD or one of the following Advanced Practice Providers on your designated Care Team:    Rosaria Ferries, PA-C  Jory Sims, DNP, ANP  Cadence Kathlen Mody, PA-C    Studies Ordered:   No orders of the defined types were placed in this encounter.    Glenetta Hew, M.D., M.S. Interventional Cardiologist   Pager # [phone] Phone # [phone] [address]. [place], [place] [address]   Thank you for choosing Heartcare at [facility]!!

## 2019-09-30 ENCOUNTER — Encounter: Payer: Self-pay | Admitting: Cardiology

## 2019-09-30 NOTE — Assessment & Plan Note (Signed)
My clinical diagnosis of her chest pain was that this was costochondritis/chest wall discomfort.  We talked about as needed use of ibuprofen and Tylenol since that is what worked at our last visit.

## 2019-09-30 NOTE — Assessment & Plan Note (Signed)
Chest pain was not exertional with max effort on her treadmill.  This would argue against fixed coronary disease causing her symptoms.  Given her young age and lack of risk factors, would not further evaluate this time.

## 2019-09-30 NOTE — Assessment & Plan Note (Signed)
Unfortunately, we were not able to capture anything on the monitor.  Most the time she hit the button, she was in sinus rhythm or just PACs.  No signs of any arrhythmias.  She did not necessarily have any major symptoms when she wore it.  We still are not sure what was going on, but seems like relatively benign at this point.  Continue to monitor for now.  Would be reluctant to treat medically as documented arrhythmia.  I did briefly discuss vagal maneuvers if symptoms were to recur.

## 2019-11-21 ENCOUNTER — Other Ambulatory Visit (HOSPITAL_COMMUNITY)
Admission: RE | Admit: 2019-11-21 | Discharge: 2019-11-21 | Disposition: A | Discharge status: Home / Self Care | Payer: 59 | Source: Ambulatory Visit | Attending: Gastroenterology | Admitting: Gastroenterology

## 2019-11-21 DIAGNOSIS — Z01812 Encounter for preprocedural laboratory examination: Secondary | ICD-10-CM | POA: Diagnosis present

## 2019-11-21 DIAGNOSIS — Z20822 Contact with and (suspected) exposure to covid-19: Secondary | ICD-10-CM | POA: Diagnosis not present

## 2019-11-21 LAB — SARS CORONAVIRUS 2 (TAT 6-24 HRS): SARS Coronavirus 2: NEGATIVE

## 2019-11-23 NOTE — Anesthesia Preprocedure Evaluation (Addendum)
Anesthesia Evaluation  Patient identified by MRN, date of birth, ID band Patient awake    Reviewed: Allergy & Precautions, NPO status , Patient's Chart, lab work & pertinent test results  History of Anesthesia Complications Negative for: history of anesthetic complications  Airway Mallampati: III  TM Distance: >3 FB     Dental no notable dental hx. (+) Dental Advisory Given   Pulmonary neg pulmonary ROS, former smoker,    Pulmonary exam normal        Cardiovascular negative cardio ROS Normal cardiovascular exam  Negative adequate stress test, no evidence of ischemia.   Neuro/Psych negative neurological ROS     GI/Hepatic negative GI ROS, Neg liver ROS,   Endo/Other  Morbid obesity  Renal/GU negative Renal ROS     Musculoskeletal negative musculoskeletal ROS (+)   Abdominal   Peds  Hematology negative hematology ROS (+)   Anesthesia Other Findings   Reproductive/Obstetrics                            Anesthesia Physical Anesthesia Plan  ASA: III  Anesthesia Plan: MAC   Post-op Pain Management:    Induction:   PONV Risk Score and Plan: 2 and Ondansetron and Propofol infusion  Airway Management Planned: Natural Airway  Additional Equipment:   Intra-op Plan:   Post-operative Plan:   Informed Consent: I have reviewed the patients History and Physical, chart, labs and discussed the procedure including the risks, benefits and alternatives for the proposed anesthesia with the patient or authorized representative who has indicated his/her understanding and acceptance.     Dental advisory given  Plan Discussed with: Anesthesiologist and CRNA  Anesthesia Plan Comments:        Anesthesia Quick Evaluation

## 2019-11-24 ENCOUNTER — Ambulatory Visit (HOSPITAL_COMMUNITY): Payer: 59 | Admitting: Anesthesiology

## 2019-11-24 ENCOUNTER — Other Ambulatory Visit: Payer: Self-pay

## 2019-11-24 ENCOUNTER — Encounter (HOSPITAL_COMMUNITY): Payer: Self-pay | Admitting: Gastroenterology

## 2019-11-24 ENCOUNTER — Encounter (HOSPITAL_COMMUNITY)
Admission: RE | Disposition: A | Discharge status: Home / Self Care | Payer: Self-pay | Source: Home / Self Care | Attending: Gastroenterology

## 2019-11-24 ENCOUNTER — Ambulatory Visit (HOSPITAL_COMMUNITY)
Admission: RE | Admit: 2019-11-24 | Discharge: 2019-11-24 | Disposition: A | Discharge status: Home / Self Care | Payer: 59 | Attending: Gastroenterology | Admitting: Gastroenterology

## 2019-11-24 DIAGNOSIS — Z79899 Other long term (current) drug therapy: Secondary | ICD-10-CM | POA: Diagnosis not present

## 2019-11-24 DIAGNOSIS — K648 Other hemorrhoids: Secondary | ICD-10-CM | POA: Diagnosis not present

## 2019-11-24 DIAGNOSIS — Z800 Family history of malignant neoplasm of digestive organs: Secondary | ICD-10-CM | POA: Diagnosis not present

## 2019-11-24 DIAGNOSIS — Z1211 Encounter for screening for malignant neoplasm of colon: Secondary | ICD-10-CM | POA: Insufficient documentation

## 2019-11-24 DIAGNOSIS — Z7982 Long term (current) use of aspirin: Secondary | ICD-10-CM | POA: Diagnosis not present

## 2019-11-24 DIAGNOSIS — D175 Benign lipomatous neoplasm of intra-abdominal organs: Secondary | ICD-10-CM | POA: Diagnosis not present

## 2019-11-24 DIAGNOSIS — Z87891 Personal history of nicotine dependence: Secondary | ICD-10-CM | POA: Insufficient documentation

## 2019-11-24 DIAGNOSIS — K644 Residual hemorrhoidal skin tags: Secondary | ICD-10-CM | POA: Diagnosis not present

## 2019-11-24 HISTORY — PX: POLYPECTOMY: SHX5525

## 2019-11-24 HISTORY — PX: COLONOSCOPY WITH PROPOFOL: SHX5780

## 2019-11-24 HISTORY — PX: BIOPSY: SHX5522

## 2019-11-24 SURGERY — COLONOSCOPY WITH PROPOFOL
Anesthesia: Monitor Anesthesia Care

## 2019-11-24 MED ORDER — PROPOFOL 500 MG/50ML IV EMUL
INTRAVENOUS | Status: DC | PRN
  Administered 2019-11-24: 140 ug/kg/min via INTRAVENOUS

## 2019-11-24 MED ORDER — PROPOFOL 10 MG/ML IV BOLUS
INTRAVENOUS | Status: DC | PRN
  Administered 2019-11-24: 40 mg via INTRAVENOUS
  Administered 2019-11-24: 50 mg via INTRAVENOUS

## 2019-11-24 MED ORDER — SODIUM CHLORIDE 0.9 % IV SOLN: INTRAVENOUS | Status: DC

## 2019-11-24 MED ORDER — LIDOCAINE 2% (20 MG/ML) 5 ML SYRINGE
INTRAMUSCULAR | Status: DC | PRN
  Administered 2019-11-24: 80 mg via INTRAVENOUS

## 2019-11-24 MED ORDER — LACTATED RINGERS IV SOLN
INTRAVENOUS | Status: DC
  Administered 2019-11-24: 1000 mL via INTRAVENOUS

## 2019-11-24 SURGICAL SUPPLY — 22 items

## 2019-11-24 NOTE — Transfer of Care (Signed)
Immediate Anesthesia Transfer of Care Note  Patient: Kyndahl W Miller  Procedure(s) Performed: COLONOSCOPY WITH PROPOFOL (N/A ) BIOPSY POLYPECTOMY  Patient Location: PACU and Endoscopy Unit  Anesthesia Type:MAC  Level of Consciousness: awake, alert  and oriented  Airway & Oxygen Therapy: Patient Spontanous Breathing and Patient connected to face mask oxygen  Post-op Assessment: Report given to RN and Post -op Vital signs reviewed and stable  Post vital signs: Reviewed and stable  Last Vitals:  Vitals Value Taken Time  BP    Temp    Pulse 68 11/24/19 0902  Resp 18 11/24/19 0902  SpO2 100 % 11/24/19 0902  Vitals shown include unvalidated device data.  Last Pain:  Vitals:   11/24/19 0801  TempSrc: Oral  PainSc: 0-No pain         Complications: No complications documented.

## 2019-11-24 NOTE — Discharge Instructions (Signed)

## 2019-11-24 NOTE — H&P (Signed)
Primary Care Physician:  Caren Macadam, MD Primary Gastroenterologist:  Dr. Alessandra Bevels  Reason for Visit : Colon cancer screening  HPI: Crystal Lester is a 39 y.o. female here for colonoscopy for colon cancer screening.  Past medical history of colon cancer in father at age 81.  Patient with occasional constipation and occasional acid reflux.  Denies any other GI symptoms.  Past Medical History:  Diagnosis Date   Hypoglycemia     History reviewed. No pertinent surgical history.  Prior to Admission medications   Medication Sig Start Date End Date Taking? Authorizing Provider  FLUoxetine (PROZAC) 40 MG capsule Take 40 mg by mouth daily.  05/27/19  Yes [provider]  Melatonin 10 MG CAPS Take 10 mg by mouth at bedtime as needed (sleep).   Yes [provider]  Multiple Vitamins-Minerals (ADULT GUMMY) CHEW Chew 2 capsules by mouth 2 (two) times a week.   Yes [provider]  omeprazole (PRILOSEC OTC) 20 MG tablet Take 20 mg by mouth daily.   Yes [provider]  phenylephrine (SUDAFED PE) 10 MG TABS tablet Take 10 mg by mouth daily as needed (allergies).   Yes [provider]  aspirin-acetaminophen-caffeine (EXCEDRIN MIGRAINE) [phone] MG tablet Take 2 tablets by mouth every 8 (eight) hours as needed for headache or migraine.    [provider]    Scheduled Meds: Continuous Infusions:  sodium chloride     lactated ringers     PRN Meds:.  Allergies as of 09/20/2019 - Review Complete 08/30/2019  Allergen Reaction Noted   Iodine  05/17/2011   Latex Itching 08/30/2019   Other Nausea And Vomiting 07/28/2016    Family History  Problem Relation Age of Onset   Cancer Other    Hypertension Mother    Diabetes Mellitus II Mother    Heart attack Father 82   CAD Father    Hypertension Father    Colon cancer Father    Healthy Brother    Hypertension Paternal Grandmother    Hypertension Paternal Grandfather     Stroke Paternal Grandfather     Social History   Socioeconomic History   Marital status: Single    Spouse name: Not on file   Number of children: Not on file   Years of education: Not on file   Highest education level: Not on file  Occupational History   Not on file  Tobacco Use   Smoking status: Former Smoker    Packs/day: 0.50    Types: Cigarettes    Quit date: 2018    Years since quitting: 3.7   Smokeless tobacco: Never Used  Substance and Sexual Activity   Alcohol use: Not Currently    Comment: social   Drug use: No   Sexual activity: Yes    Partners: Male  Other Topics Concern   Not on file  Social History Narrative   Married mother of 4-from first marriage 13, 69, 35 and 38 years old. ->  All the 38 year old with her..   Current husband is disabled-lost to likes to complications of diabetes and CKD/ESRD.   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   Social Determinants of Health   Financial Resource Strain:    Difficulty of Paying Living Expenses: Not on file  Food Insecurity:    Worried About [employer] in the Last Year: Not on file   Ran Out of Food in the Last Year: Not  on file  Transportation Needs:    Lack of Transportation (Medical): Not on file   Lack of Transportation (Non-Medical): Not on file  Physical Activity:    Days of Exercise per Week: Not on file   Minutes of Exercise per Session: Not on file  Stress:    Feeling of Stress : Not on file  Social Connections:    Frequency of Communication with Friends and Family: Not on file   Frequency of Social Gatherings with Friends and Family: Not on file   Attends Religious Services: Not on file   Active Member of Clubs or Organizations: Not on file   Attends Archivist Meetings: Not on file   Marital Status: Not on file  Intimate Partner Violence:    Fear of Current or Ex-Partner: Not on file   Emotionally  Abused: Not on file   Physically Abused: Not on file   Sexually Abused: Not on file    Review of Systems: All negative except as stated above in HPI.  Physical Exam: Vital signs: Vitals:   11/24/19 0801  BP: (!) 135/37  Resp: 17  Temp: 98.1 F (36.7 C)  SpO2: 99%     General:   Alert,  Well-developed, well-nourished, pleasant and cooperative in NAD Lungs:  Clear throughout to auscultation.   No wheezes, crackles, or rhonchi. No acute distress. Heart:  Regular rate and rhythm; no murmurs, clicks, rubs,  or gallops. Abdomen: Soft, nontender, nondistended, bowel sounds present Rectal:  Deferred  GI:  Lab Results: No results for input(s): WBC, HGB, HCT, PLT in the last 72 hours. BMET No results for input(s): NA, K, CL, CO2, GLUCOSE, BUN, CREATININE, CALCIUM in the last 72 hours. LFT No results for input(s): PROT, ALBUMIN, AST, ALT, ALKPHOS, BILITOT, BILIDIR, IBILI in the last 72 hours. PT/INR No results for input(s): LABPROT, INR in the last 72 hours.   Studies/Results: No results found.  Impression/Plan: -Colon cancer screening -Family history of colon cancer in father at age 58 -Morbid obesity  Recommendations ----------------------- -Proceed with colonoscopy today.  Risks (bleeding, infection, bowel perforation that could require surgery, sedation-related changes in cardiopulmonary systems), benefits (identification and possible treatment of source of symptoms, exclusion of certain causes of symptoms), and alternatives (watchful waiting, radiographic imaging studies, empiric medical treatment)  were explained to patient/family in detail and patient wishes to proceed.    LOS: 0 days   Otis Brace  MD, FACP 11/24/2019, 8:18 AM  Contact #  [phone]

## 2019-11-24 NOTE — Anesthesia Postprocedure Evaluation (Signed)
Anesthesia Post Note  Patient: Crystal Lester  Procedure(s) Performed: COLONOSCOPY WITH PROPOFOL (N/A ) BIOPSY POLYPECTOMY     Patient location during evaluation: Endoscopy Anesthesia Type: MAC Level of consciousness: awake and alert Pain management: pain level controlled Vital Signs Assessment: post-procedure vital signs reviewed and stable Respiratory status: spontaneous breathing, nonlabored ventilation, respiratory function stable and patient connected to nasal cannula oxygen Cardiovascular status: blood pressure returned to baseline and stable Postop Assessment: no apparent nausea or vomiting Anesthetic complications: no   No complications documented.  Last Vitals:  Vitals:   11/24/19 0925 11/24/19 0930  BP: 133/76 131/80  Pulse: 61 (!) 55  Resp: 16 15  Temp:    SpO2: 100% 100%    Last Pain:  Vitals:   11/24/19 0930  TempSrc:   PainSc: 0-No pain                 Citlali Gautney DANIEL

## 2019-11-24 NOTE — Op Note (Signed)
[facility] Patient Name: Crystal Lester Procedure Date: 11/24/2019 MRN: [id number] Attending MD: Otis Brace , MD Date of Birth: March 09, 1981 CSN: [id number] Age: 39 Admit Type: Outpatient Procedure:                Colonoscopy Indications:              Screening in patient at increased risk: Family                            history of 1st-degree relative with colorectal                            cancer before age 21 years, This is the patient's                            first colonoscopy Providers:                Otis Brace, MD, Clyde Lundborg, RN, Fransico Setters                            [place], Technician Referring MD:              Medicines:                Sedation Administered by an Anesthesia Professional Complications:            No immediate complications. Estimated Blood Loss:     Estimated blood loss: none. Procedure:                Pre-Anesthesia Assessment:                           - Prior to the procedure, a History and Physical                            was performed, and patient medications and                            allergies were reviewed. The patient's tolerance of                            previous anesthesia was also reviewed. The risks                            and benefits of the procedure and the sedation                            options and risks were discussed with the patient.                            All questions were answered, and informed consent                            was obtained. Prior Anticoagulants: The patient has  taken no previous anticoagulant or antiplatelet                            agents. ASA Grade Assessment: III - A patient with                            severe systemic disease. After reviewing the risks                            and benefits, the patient was deemed in                            satisfactory condition to undergo the procedure.                           After  obtaining informed consent, the colonoscope                            was passed under direct vision. Throughout the                            procedure, the patient's blood pressure, pulse, and                            oxygen saturations were monitored continuously. The                            PCF-H190DL ([id number]) Olympus pediatric colonscope                            was introduced through the anus and advanced to the                            the terminal ileum, with identification of the                            appendiceal orifice and IC valve. The colonoscopy                            was performed without difficulty. The patient                            tolerated the procedure well. The quality of the                            bowel preparation was adequate to identify polyps 6                            mm and larger in size. Scope In: 8:38:45 AM Scope Out: 8:57:29 AM Scope Withdrawal Time: 0 hours 12 minutes 39 seconds  Total Procedure Duration: 0 hours 18 minutes 44 seconds  Findings:      Skin tags were found on perianal exam.      The terminal ileum appeared normal.  There was a medium-sized lipoma, in the ascending colon. Biopsies were       taken with a cold forceps for histology.      A 2 mm polyp was found in the transverse colon. The polyp was removed       with a cold biopsy forceps. Resection and retrieval were complete.      Retroflexion in the right colon was performed.      Internal hemorrhoids were found during retroflexion. The hemorrhoids       were small. Impression:               - Perianal skin tags found on perianal exam.                           - The examined portion of the ileum was normal.                           - Medium-sized lipoma in the ascending colon.                            Biopsied.                           - One 2 mm polyp in the transverse colon, removed                            with a cold biopsy forceps. Resected  and retrieved.                           - Internal hemorrhoids. Moderate Sedation:      Moderate (conscious) sedation was personally administered by an       anesthesia professional. The following parameters were monitored: oxygen       saturation, heart rate, blood pressure, and response to care. Recommendation:           - Patient has a contact number available for                            emergencies. The signs and symptoms of potential                            delayed complications were discussed with the                            patient. Return to normal activities tomorrow.                            Written discharge instructions were provided to the                            patient.                           - Resume previous diet.                           - Continue present medications.                           -  Await pathology results.                           - Repeat colonoscopy in 5 years for surveillance.                           - Return to my office PRN. Procedure Code(s):        --- Professional ---                           [phone], Colonoscopy, flexible; with biopsy, single                            or multiple Diagnosis Code(s):        --- Professional ---                           Z80.0, Family history of malignant neoplasm of                            digestive organs                           K64.8, Other hemorrhoids                           D17.5, Benign lipomatous neoplasm of                            intra-abdominal organs                           K63.5, Polyp of colon                           K64.4, Residual hemorrhoidal skin tags CPT copyright 2019 American Medical Association. All rights reserved. The codes documented in this report are preliminary and upon coder review may  be revised to meet current compliance requirements. Otis Brace, MD Otis Brace, MD 11/24/2019 9:07:08 AM Number of Addenda: 0

## 2019-11-25 ENCOUNTER — Encounter (HOSPITAL_COMMUNITY): Payer: Self-pay | Admitting: Gastroenterology

## 2019-11-25 LAB — SURGICAL PATHOLOGY

## 2020-03-10 NOTE — L&D Delivery Note (Signed)
OB/GYN Faculty Practice Delivery Note  Crystal Lester is a 40 y.o. [id number] s/p SVD at [redacted]w[redacted]d. She was admitted for IOL due to decreased fetal movement and NRFHT.   ROM: 8h 1m with clear fluid GBS Status: Negative  Delivery Date/Time: 02/15/21 at 2308  Delivery: Called to room and patient was complete and pushing. Head delivered LOA. No nuchal cord present. Shoulder and body delivered in usual fashion. Infant with spontaneous cry, placed on mother's abdomen, dried and stimulated. Cord clamped x 2 after 1-minute delay and cut by FOB under my direct supervision. Cord blood drawn. Placenta delivered spontaneously with gentle cord traction. Fundus firm with massage and Pitocin, however, lower uterine segment remained boggy with brisk bleeding. TXA and Cytotec 1000 mcg per rectum given. Labia, perineum, vagina, and cervix were inspected, and patient was found to have a small vaginal floor abrasion that was hemostatic and not repaired. Upon reassessment, uterine tone and bleeding improved after medications given and massage.  Placenta: Intact, 3VC - sent to L&D  Complications: Postpartum hemorrhage (Pitocin, TXA, Cytotec given) Lacerations: Vaginal floor abrasion  EBL: 1002 cc Analgesia: Epidural   Infant: Viable female  APGARs 70 and 8  Vilma Meckel, MD OB/GYN Fellow, Faculty Practice

## 2020-10-22 LAB — OB RESULTS CONSOLE HIV ANTIBODY (ROUTINE TESTING): HIV: NONREACTIVE

## 2020-10-22 LAB — OB RESULTS CONSOLE HEPATITIS B SURFACE ANTIGEN: Hepatitis B Surface Ag: NEGATIVE

## 2020-10-22 LAB — OB RESULTS CONSOLE RUBELLA ANTIBODY, IGM: Rubella: IMMUNE

## 2020-10-22 LAB — OB RESULTS CONSOLE RPR: RPR: NONREACTIVE

## 2020-10-22 LAB — OB RESULTS CONSOLE GC/CHLAMYDIA
Chlamydia: NEGATIVE
Gonorrhea: NEGATIVE

## 2020-10-22 LAB — HEPATITIS C ANTIBODY: HCV Ab: NEGATIVE

## 2020-11-23 ENCOUNTER — Other Ambulatory Visit: Payer: Self-pay | Admitting: Nurse Practitioner

## 2020-11-23 DIAGNOSIS — Z363 Encounter for antenatal screening for malformations: Secondary | ICD-10-CM

## 2020-11-26 ENCOUNTER — Other Ambulatory Visit: Payer: Self-pay

## 2020-12-06 ENCOUNTER — Encounter: Payer: Self-pay | Admitting: *Deleted

## 2020-12-11 ENCOUNTER — Other Ambulatory Visit: Payer: Self-pay | Admitting: *Deleted

## 2020-12-11 ENCOUNTER — Encounter: Payer: Self-pay | Admitting: *Deleted

## 2020-12-11 ENCOUNTER — Other Ambulatory Visit: Payer: Self-pay

## 2020-12-11 ENCOUNTER — Ambulatory Visit: Payer: Medicaid Other | Attending: Nurse Practitioner

## 2020-12-11 ENCOUNTER — Ambulatory Visit (HOSPITAL_BASED_OUTPATIENT_CLINIC_OR_DEPARTMENT_OTHER): Payer: Medicaid Other

## 2020-12-11 ENCOUNTER — Ambulatory Visit: Payer: Medicaid Other | Admitting: *Deleted

## 2020-12-11 VITALS — BP 117/57 | HR 81 | Ht 67.5 in

## 2020-12-11 DIAGNOSIS — O09523 Supervision of elderly multigravida, third trimester: Secondary | ICD-10-CM

## 2020-12-11 DIAGNOSIS — Z3689 Encounter for other specified antenatal screening: Secondary | ICD-10-CM

## 2020-12-11 DIAGNOSIS — Z362 Encounter for other antenatal screening follow-up: Secondary | ICD-10-CM

## 2020-12-11 DIAGNOSIS — R638 Other symptoms and signs concerning food and fluid intake: Secondary | ICD-10-CM

## 2020-12-11 DIAGNOSIS — Z363 Encounter for antenatal screening for malformations: Secondary | ICD-10-CM | POA: Insufficient documentation

## 2020-12-12 NOTE — Progress Notes (Addendum)
Name: Crystal Lester Indication: Advanced Maternal Age  DOB: Crystal Lester, 1982 Age: 40 y.o.   EDC: 02/25/2021 LMP: 05/21/2020 Referring Provider:  Dessie Coma NP  EGA: [redacted]w[redacted]d Genetic Counselor: Crystal Righter, MS, [place]  OB Hx: [id number] Date of Appointment: 12/11/2020  Accompanied by: Father of the current pregnancy, Crystal Lester Face to Face Time: 40 Minutes   Previous Testing Completed: CBC from 05/01/2019 reviewed. MCV within normal limits. It is unlikely that Crystal Lester 31, 2023 is a beta thalassemia carrier or an alpha thalassemia carrier of the double-gene deletion. Individuals with a normal MCV may be single-gene deletion carriers, but it is unlikely that the current pregnancy would be affected with alpha or beta thalassemia major. 07-08-2022 previously completed Carrier Screening for Cystic Fibrosis in this pregnancy (scanned into Epic under the Media tab). She screened to not be a carrier for Cystic Fibrosis. A negative result on carrier screening reduces the likelihood of being a carrier, however, does not entirely rule out the possibility.   Medical History:  This is Crystal Lester's 5th pregnancy. She has 4 living children from a previous partner. Reports she takes allergy medications, prenatal vitamins, and iron. Denies personal history of diabetes, high blood pressure, thyroid conditions, and seizures. Denies bleeding, infections, and fevers in this pregnancy. Denies using tobacco, alcohol, or street drugs in this pregnancy.   Family History: A pedigree was created and scanned into Epic under the Media tab. Crystal Lester 31, 2023 reports her father (deceased at age 17) was diagnosed with colon cancer at age 16. 07/08/2022 reports a paternal uncle (deceased) who had stomach cancer. 2022-07-08 reports another paternal uncle (deceased) who had a brain tumor. 07/08/22 reports a paternal aunt (deceased) and paternal uncle (deceased) whose cause of death is not known. 07-08-22 reports her paternal grandmother (deceased) had skin cancer.  Maternal  ethnicity reported as Caucasian/Hispanic and paternal ethnicity reported as Financial controller. Denies Ashkenazi Jewish ancestry. Family history not remarkable for consanguinity, individuals with birth defects, intellectual disability, autism spectrum disorder, mental illness, multiple spontaneous abortions, still births, or unexplained neonatal death.     Genetic Counseling:   Advanced Maternal Age. With delivery at 22 years, Crystal Lester's age-related risk to have a pregnancy with Down syndrome is 1/129 and risk to have a pregnancy with any chromosome condition is 1/81. Information about the most common chromosome conditions such as Down syndrome, Trisomy 40, Trisomy 57, and common sex chromosome aneuploidies was described, as well as specifics about screening vs. diagnostic testing in pregnancy.  Birth Defects. All babies have approximately a 3-5% risk for a birth defect and a majority of these defects cannot be detected through the screening or diagnostic testing listed below. Ultrasound may detect some birth defects, but it may not detect all birth defects. About half of pregnancies with Down syndrome do not show any soft markers on ultrasound. A normal ultrasound does not guarantee a healthy pregnancy.  Family History of Cancer. Given Crystal Lester's family history of cancer, we discussed the benefits of seeing a cancer genetic counselor to assess the risk of an inherited cancer syndrome. It is also important for 07/08/2022 to further discuss this family history with their physician in order to begin proper cancer screening and detection.   Testing/Screening Options:   Amniocentesis. This procedure is available for prenatal diagnosis. Possible procedural difficulties and complications that can arise include maternal infection, cramping, bleeding, fluid leakage, and/or pregnancy loss. The risk for pregnancy loss with an amniocentesis is 1/500. Per the [place] and Gynecologists (ACOG)  Practice Bulletin 162, all  pregnant women should be offered prenatal assessment for aneuploidy by diagnostic testing regardless of maternal age or other risk factors. If indicated, genetic testing that could be ordered on an amniocentesis sample includes a fetal karyotype, fetal microarray, and testing for specific syndromes.  Non-invasive prenatal screening (NIPS). This can screen the pregnancy for aneuploidy involving chromosomes 13, 18, 21, X, and Y. If NIPS results indicate high-risk for a chromosomal aneuploidy, prenatal diagnosis via CVS or amniocentesis would be recommended. A low-risk NIPS result does not ensure an unaffected pregnancy. NIPS does not screen for neural tube defects or other genetic conditions. Per the ACOG Practice Bulletin 46 all pregnant women regardless of age, baseline risk, and number of fetuses should be offered all screening options including NIPS which was previously only offered for singleton high risk pregnancies.  Carrier screening. Per the ACOG Committee Opinion 691, all women who are considering a pregnancy or are currently pregnant should be offered carrier screening for, at minimum, Cystic Fibrosis (CF), Spinal Muscular Atrophy (SMA), and Hemoglobinopathies The mode of inheritance, clinical manifestations of these conditions, as well as details about testing were reviewed. A negative result on carrier screening reduces the likelihood of being a carrier, however, does not entirely rule out the possibility. If Crystal Lester was found to be a carrier for a specific condition, carrier screening for their reproductive partner would be recommended.    Patient Plan:  Proceed with: Routine prenatal care All questions were answered.  Declined: Amniocentesis, NIPS, Additional Carrier Screening for SMA and Hemoglobinopathies   Thank you for sharing in the care of Crystal Lester with [place].  Please do not hesitate to contact us if you have any questions.  Crystal Righter, MS, [facility]

## 2020-12-24 ENCOUNTER — Ambulatory Visit: Payer: Self-pay

## 2021-01-09 ENCOUNTER — Other Ambulatory Visit: Payer: Self-pay

## 2021-01-09 ENCOUNTER — Encounter: Payer: Self-pay | Admitting: *Deleted

## 2021-01-09 ENCOUNTER — Ambulatory Visit: Payer: Medicaid Other | Admitting: *Deleted

## 2021-01-09 ENCOUNTER — Other Ambulatory Visit: Payer: Self-pay | Admitting: *Deleted

## 2021-01-09 ENCOUNTER — Ambulatory Visit: Payer: Medicaid Other | Attending: Obstetrics

## 2021-01-09 VITALS — BP 130/61 | HR 76

## 2021-01-09 DIAGNOSIS — O09523 Supervision of elderly multigravida, third trimester: Secondary | ICD-10-CM

## 2021-01-09 DIAGNOSIS — Z3689 Encounter for other specified antenatal screening: Secondary | ICD-10-CM | POA: Diagnosis present

## 2021-01-09 DIAGNOSIS — Z3A33 33 weeks gestation of pregnancy: Secondary | ICD-10-CM

## 2021-01-09 DIAGNOSIS — R638 Other symptoms and signs concerning food and fluid intake: Secondary | ICD-10-CM | POA: Insufficient documentation

## 2021-01-09 DIAGNOSIS — O99213 Obesity complicating pregnancy, third trimester: Secondary | ICD-10-CM

## 2021-01-09 DIAGNOSIS — Z6841 Body Mass Index (BMI) 40.0 and over, adult: Secondary | ICD-10-CM

## 2021-01-09 DIAGNOSIS — E669 Obesity, unspecified: Secondary | ICD-10-CM | POA: Diagnosis not present

## 2021-01-09 DIAGNOSIS — Z362 Encounter for other antenatal screening follow-up: Secondary | ICD-10-CM | POA: Insufficient documentation

## 2021-01-14 ENCOUNTER — Ambulatory Visit: Payer: Medicaid Other | Admitting: *Deleted

## 2021-01-14 ENCOUNTER — Ambulatory Visit: Payer: Medicaid Other | Attending: Obstetrics and Gynecology

## 2021-01-14 ENCOUNTER — Encounter: Payer: Self-pay | Admitting: *Deleted

## 2021-01-14 ENCOUNTER — Other Ambulatory Visit: Payer: Self-pay

## 2021-01-14 VITALS — BP 127/63 | HR 72

## 2021-01-14 DIAGNOSIS — O99213 Obesity complicating pregnancy, third trimester: Secondary | ICD-10-CM | POA: Diagnosis not present

## 2021-01-14 DIAGNOSIS — E669 Obesity, unspecified: Secondary | ICD-10-CM

## 2021-01-14 DIAGNOSIS — O09523 Supervision of elderly multigravida, third trimester: Secondary | ICD-10-CM | POA: Insufficient documentation

## 2021-01-14 DIAGNOSIS — Z3A34 34 weeks gestation of pregnancy: Secondary | ICD-10-CM

## 2021-01-14 DIAGNOSIS — Z6841 Body Mass Index (BMI) 40.0 and over, adult: Secondary | ICD-10-CM | POA: Insufficient documentation

## 2021-01-21 ENCOUNTER — Ambulatory Visit: Payer: Medicaid Other | Admitting: *Deleted

## 2021-01-21 ENCOUNTER — Encounter: Payer: Self-pay | Admitting: *Deleted

## 2021-01-21 ENCOUNTER — Ambulatory Visit: Payer: Medicaid Other | Attending: Obstetrics and Gynecology

## 2021-01-21 ENCOUNTER — Other Ambulatory Visit: Payer: Self-pay

## 2021-01-21 VITALS — BP 128/70 | HR 76

## 2021-01-21 DIAGNOSIS — O99213 Obesity complicating pregnancy, third trimester: Secondary | ICD-10-CM | POA: Diagnosis not present

## 2021-01-21 DIAGNOSIS — Z6841 Body Mass Index (BMI) 40.0 and over, adult: Secondary | ICD-10-CM | POA: Diagnosis present

## 2021-01-21 DIAGNOSIS — E669 Obesity, unspecified: Secondary | ICD-10-CM

## 2021-01-21 DIAGNOSIS — O09523 Supervision of elderly multigravida, third trimester: Secondary | ICD-10-CM

## 2021-01-21 DIAGNOSIS — Z3A35 35 weeks gestation of pregnancy: Secondary | ICD-10-CM | POA: Diagnosis not present

## 2021-01-30 ENCOUNTER — Ambulatory Visit: Payer: Medicaid Other | Attending: Obstetrics and Gynecology

## 2021-01-30 ENCOUNTER — Encounter: Payer: Self-pay | Admitting: *Deleted

## 2021-01-30 ENCOUNTER — Ambulatory Visit: Payer: Medicaid Other | Admitting: *Deleted

## 2021-01-30 ENCOUNTER — Other Ambulatory Visit: Payer: Self-pay

## 2021-01-30 ENCOUNTER — Other Ambulatory Visit: Payer: Self-pay | Admitting: *Deleted

## 2021-01-30 VITALS — BP 128/75 | HR 69

## 2021-01-30 DIAGNOSIS — O09523 Supervision of elderly multigravida, third trimester: Secondary | ICD-10-CM

## 2021-01-30 DIAGNOSIS — O99213 Obesity complicating pregnancy, third trimester: Secondary | ICD-10-CM

## 2021-01-30 DIAGNOSIS — Z3A36 36 weeks gestation of pregnancy: Secondary | ICD-10-CM | POA: Diagnosis not present

## 2021-01-30 DIAGNOSIS — Z6841 Body Mass Index (BMI) 40.0 and over, adult: Secondary | ICD-10-CM | POA: Insufficient documentation

## 2021-01-30 DIAGNOSIS — E669 Obesity, unspecified: Secondary | ICD-10-CM

## 2021-01-30 DIAGNOSIS — R638 Other symptoms and signs concerning food and fluid intake: Secondary | ICD-10-CM

## 2021-02-05 ENCOUNTER — Ambulatory Visit: Payer: Medicaid Other | Attending: Obstetrics and Gynecology

## 2021-02-05 ENCOUNTER — Ambulatory Visit: Payer: Medicaid Other | Admitting: *Deleted

## 2021-02-05 ENCOUNTER — Other Ambulatory Visit: Payer: Self-pay

## 2021-02-05 VITALS — BP 141/97 | HR 80

## 2021-02-05 DIAGNOSIS — O09523 Supervision of elderly multigravida, third trimester: Secondary | ICD-10-CM | POA: Diagnosis not present

## 2021-02-05 DIAGNOSIS — O99213 Obesity complicating pregnancy, third trimester: Secondary | ICD-10-CM | POA: Diagnosis not present

## 2021-02-05 DIAGNOSIS — E669 Obesity, unspecified: Secondary | ICD-10-CM

## 2021-02-05 DIAGNOSIS — Z3A37 37 weeks gestation of pregnancy: Secondary | ICD-10-CM

## 2021-02-05 DIAGNOSIS — Z6841 Body Mass Index (BMI) 40.0 and over, adult: Secondary | ICD-10-CM | POA: Diagnosis present

## 2021-02-08 ENCOUNTER — Inpatient Hospital Stay (HOSPITAL_COMMUNITY)
Admission: AD | Admit: 2021-02-08 | Discharge: 2021-02-08 | Disposition: A | Discharge status: Home / Self Care | Payer: Medicaid Other | Attending: Obstetrics and Gynecology | Admitting: Obstetrics and Gynecology

## 2021-02-08 ENCOUNTER — Encounter (HOSPITAL_COMMUNITY): Payer: Self-pay | Admitting: Obstetrics and Gynecology

## 2021-02-08 ENCOUNTER — Other Ambulatory Visit: Payer: Self-pay | Admitting: Obstetrics and Gynecology

## 2021-02-08 DIAGNOSIS — O10913 Unspecified pre-existing hypertension complicating pregnancy, third trimester: Secondary | ICD-10-CM

## 2021-02-08 DIAGNOSIS — O26893 Other specified pregnancy related conditions, third trimester: Secondary | ICD-10-CM | POA: Insufficient documentation

## 2021-02-08 DIAGNOSIS — O163 Unspecified maternal hypertension, third trimester: Secondary | ICD-10-CM | POA: Diagnosis not present

## 2021-02-08 DIAGNOSIS — O471 False labor at or after 37 completed weeks of gestation: Secondary | ICD-10-CM | POA: Insufficient documentation

## 2021-02-08 DIAGNOSIS — O09523 Supervision of elderly multigravida, third trimester: Secondary | ICD-10-CM | POA: Diagnosis not present

## 2021-02-08 DIAGNOSIS — Z3A37 37 weeks gestation of pregnancy: Secondary | ICD-10-CM | POA: Insufficient documentation

## 2021-02-08 DIAGNOSIS — R519 Headache, unspecified: Secondary | ICD-10-CM | POA: Insufficient documentation

## 2021-02-08 LAB — URINALYSIS, ROUTINE W REFLEX MICROSCOPIC
Bilirubin Urine: NEGATIVE
Glucose, UA: NEGATIVE mg/dL
Hgb urine dipstick: NEGATIVE
Ketones, ur: NEGATIVE mg/dL
Nitrite: NEGATIVE
Protein, ur: NEGATIVE mg/dL
Specific Gravity, Urine: 1.015 (ref 1.005–1.030)
pH: 6 (ref 5.0–8.0)

## 2021-02-08 LAB — CBC
HCT: 27.5 % — ABNORMAL LOW (ref 36.0–46.0)
Hemoglobin: 9.2 g/dL — ABNORMAL LOW (ref 12.0–15.0)
MCH: 33.1 pg (ref 26.0–34.0)
MCHC: 33.5 g/dL (ref 30.0–36.0)
MCV: 98.9 fL (ref 80.0–100.0)
Platelets: 207 10*3/uL (ref 150–400)
RBC: 2.78 MIL/uL — ABNORMAL LOW (ref 3.87–5.11)
RDW: 14.3 % (ref 11.5–15.5)
WBC: 8.4 10*3/uL (ref 4.0–10.5)
nRBC: 0 % (ref 0.0–0.2)

## 2021-02-08 LAB — COMPREHENSIVE METABOLIC PANEL
ALT: 8 U/L (ref 0–44)
AST: 13 U/L — ABNORMAL LOW (ref 15–41)
Albumin: 2.2 g/dL — ABNORMAL LOW (ref 3.5–5.0)
Alkaline Phosphatase: 161 U/L — ABNORMAL HIGH (ref 38–126)
Anion gap: 8 (ref 5–15)
BUN: 8 mg/dL (ref 6–20)
CO2: 23 mmol/L (ref 22–32)
Calcium: 8.6 mg/dL — ABNORMAL LOW (ref 8.9–10.3)
Chloride: 106 mmol/L (ref 98–111)
Creatinine, Ser: 0.7 mg/dL (ref 0.44–1.00)
GFR, Estimated: >60 mL/min (ref >60)
Glucose, Bld: 77 mg/dL (ref 70–99)
Potassium: 4.2 mmol/L (ref 3.5–5.1)
Sodium: 137 mmol/L (ref 135–145)
Total Bilirubin: 0.6 mg/dL (ref 0.3–1.2)
Total Protein: 5.8 g/dL — ABNORMAL LOW (ref 6.5–8.1)

## 2021-02-08 LAB — PROTEIN / CREATININE RATIO, URINE
Creatinine, Urine: 117.89 mg/dL
Protein Creatinine Ratio: 0.22 mg/mg{Cre} — ABNORMAL HIGH (ref 0.00–0.15)
Total Protein, Urine: 26 mg/dL

## 2021-02-08 MED ORDER — ACETAMINOPHEN 500 MG PO TABS
1000.0000 mg | ORAL_TABLET | Freq: Once | ORAL | Status: AC
  Administered 2021-02-08: 1000 mg via ORAL
  Filled 2021-02-08: qty 2

## 2021-02-08 NOTE — MAU Provider Note (Signed)
History     CSN: [id number]  Arrival date and time: 02/08/21 1302   Event Date/Time   First Provider Initiated Contact with Patient 02/08/21 1405      Chief Complaint  Patient presents with   Hypertension   Headache   Contractions   Crystal Lester is a 40 y.o. year old G39P4004 female at [redacted]w[redacted]d weeks gestation who presents to MAU reporting increased BP at home. She was told to come if her BP was above 140 or 90, she needed to come in for evaluation. She reports H/A (Middle of head forward to hairline), took Tylenol with no relief and a little dizziness. She woke up with swollen face, hands and feet. She denies visual changes, epigastric pain, VB or LOF. She reports (+) FM. She also reports some irregular mild contractions. She receives [facility] at [facility].   OB History     Gravida  5   Para  4   Term  4   Preterm      AB      Living  4      SAB      IAB      Ectopic      Multiple      Live Births  4           Past Medical History:  Diagnosis Date   Anemia    Depression    Hypoglycemia     Past Surgical History:  Procedure Laterality Date   BIOPSY  11/24/2019   Procedure: BIOPSY;  Surgeon: Otis Brace, MD;  Location: [facility];  Service: Gastroenterology;;   COLONOSCOPY WITH PROPOFOL N/A 11/24/2019   Procedure: COLONOSCOPY WITH PROPOFOL;  Surgeon: Otis Brace, MD;  Location: [facility];  Service: Gastroenterology;  Laterality: N/A;   POLYPECTOMY  11/24/2019   Procedure: POLYPECTOMY;  Surgeon: Otis Brace, MD;  Location: [facility];  Service: Gastroenterology;;    Family History  Problem Relation Age of Onset   Cancer Other    Hypertension Mother    Diabetes Mellitus II Mother    Heart attack Father 107   CAD Father    Hypertension Father    Colon cancer Father    Healthy Brother    Hypertension Paternal Grandmother    Hypertension Paternal Grandfather    Stroke Paternal Grandfather     Social History   Tobacco  Use   Smoking status: Former    Packs/day: 0.50    Types: Cigarettes    Quit date: 2018    Years since quitting: 4.9   Smokeless tobacco: Never  Vaping Use   Vaping Use: Never used  Substance Use Topics   Alcohol use: Not Currently    Comment: social   Drug use: No    Allergies:  Allergies  Allergen Reactions   Iodine     Skin irritation   Latex Itching    Minor sensitivity   Other Nausea And Vomiting    Reaction to honey    Medications Prior to Admission  Medication Sig Dispense Refill Last Dose   ferrous sulfate 325 (65 FE) MG tablet Take 325 mg by mouth daily with breakfast.   02/07/2021   omeprazole (PRILOSEC OTC) 20 MG tablet Take 20 mg by mouth daily.   02/07/2021   Prenatal Vit-Fe Fumarate-FA (PRENATAL MULTIVITAMIN) TABS tablet Take 1 tablet by mouth daily at 12 noon.   02/07/2021   phenylephrine (SUDAFED PE) 10 MG TABS tablet Take 10 mg by mouth daily as needed (  allergies). (Patient not taking: Reported on 02/05/2021)       Review of Systems  Constitutional: Negative.   HENT: Negative.    Eyes: Negative.   Respiratory: Negative.    Cardiovascular:  Positive for leg swelling.  Gastrointestinal: Negative.   Endocrine: Negative.   Genitourinary: Negative.   Musculoskeletal: Negative.   Skin: Negative.   Allergic/Immunologic: Negative.   Hematological: Negative.   Psychiatric/Behavioral: Negative.    Physical Exam   Patient Vitals for the past 24 hrs:  BP Temp Temp src Pulse Resp SpO2 Height Weight  02/08/21 1802 110/66 -- -- 76 -- 100 % -- --  02/08/21 1717 108/62 -- -- 65 -- -- -- --  02/08/21 1702 (!) 111/58 -- -- 73 -- -- -- --  02/08/21 1645 120/61 -- -- 67 -- 100 % -- --  02/08/21 1630 (!) 110/54 -- -- (!) 102 -- 99 % -- --  02/08/21 1615 (!) 96/56 -- -- 73 -- 100 % -- --  02/08/21 1602 (!) 118/59 -- -- 76 -- -- -- --  02/08/21 1545 (!) 146/78 -- -- 86 -- 100 % -- --  02/08/21 1530 131/68 -- -- 68 -- 100 % -- --  02/08/21 1515 126/74 -- -- 79 -- 100  % -- --  02/08/21 1505 (!) 100/58 -- -- 85 -- 100 % -- --  02/08/21 1445 128/61 -- -- 84 -- 100 % -- --  02/08/21 1435 123/71 -- -- 78 -- 100 % -- --  02/08/21 1413 (!) 153/84 -- -- 76 -- 99 % -- --  02/08/21 1346 (!) 131/51 98.5 F (36.9 C) Oral 83 20 100 % 5\' 8"  (1.727 m) (!) 150.1 kg    Physical Exam Vitals and nursing note reviewed.  Constitutional:      Appearance: Normal appearance. She is obese.  HENT:     Head: Normocephalic and atraumatic.  Cardiovascular:     Rate and Rhythm: Normal rate.  Abdominal:     Palpations: Abdomen is soft.  Genitourinary:    Comments: deferred Musculoskeletal:        General: Normal range of motion.     Cervical back: Normal range of motion.  Skin:    General: Skin is warm and dry.  Neurological:     Mental Status: She is alert and oriented to person, place, and time.  Psychiatric:        Mood and Affect: Mood normal.        Behavior: Behavior normal.        Thought Content: Thought content normal.        Judgment: Judgment normal.    MAU Course  Procedures  MDM CCUA CBC CMP P/C Ratio Serial BP's  Tylenol 1000 mg -- 2/10 down from 4/10 *Consult with Dr. Elonda Husky @ 1740 - notified of patient's complaints, assessments, lab & U/S results, recommended tx plan d/c home and schedule for IOL at 38-39 weeks  - ok to d/c home Results for orders placed or performed during the hospital encounter of 02/08/21 (from the past 24 hour(s))  Urinalysis, Routine w reflex microscopic Urine, Clean Catch     Status: Abnormal   Collection Time: 02/08/21  2:11 PM  Result Value Ref Range   Color, Urine YELLOW YELLOW   APPearance CLOUDY (A) CLEAR   Specific Gravity, Urine 1.015 1.005 - 1.030   pH 6.0 5.0 - 8.0   Glucose, UA NEGATIVE NEGATIVE mg/dL   Hgb urine dipstick NEGATIVE NEGATIVE   Bilirubin Urine  NEGATIVE NEGATIVE   Ketones, ur NEGATIVE NEGATIVE mg/dL   Protein, ur NEGATIVE NEGATIVE mg/dL   Nitrite NEGATIVE NEGATIVE   Leukocytes,Ua LARGE (A)  NEGATIVE   RBC / HPF 11-20 0 - 5 RBC/hpf   WBC, UA 21-50 0 - 5 WBC/hpf   Bacteria, UA MANY (A) NONE SEEN   Squamous Epithelial / LPF 21-50 0 - 5   Mucus PRESENT    Budding Yeast PRESENT   Protein / creatinine ratio, urine     Status: Abnormal   Collection Time: 02/08/21  2:36 PM  Result Value Ref Range   Creatinine, Urine 117.89 mg/dL   Total Protein, Urine 26 mg/dL   Protein Creatinine Ratio 0.22 (H) 0.00 - 0.15 mg/mg[Cre]  CBC     Status: Abnormal   Collection Time: 02/08/21  2:38 PM  Result Value Ref Range   WBC 8.4 4.0 - 10.5 K/uL   RBC 2.78 (L) 3.87 - 5.11 MIL/uL   Hemoglobin 9.2 (L) 12.0 - 15.0 g/dL   HCT 27.5 (L) 36.0 - 46.0 %   MCV 98.9 80.0 - 100.0 fL   MCH 33.1 26.0 - 34.0 pg   MCHC 33.5 30.0 - 36.0 g/dL   RDW 14.3 11.5 - 15.5 %   Platelets 207 150 - 400 K/uL   nRBC 0.0 0.0 - 0.2 %  Comprehensive metabolic panel     Status: Abnormal   Collection Time: 02/08/21  2:38 PM  Result Value Ref Range   Sodium 137 135 - 145 mmol/L   Potassium 4.2 3.5 - 5.1 mmol/L   Chloride 106 98 - 111 mmol/L   CO2 23 22 - 32 mmol/L   Glucose, Bld 77 70 - 99 mg/dL   BUN 8 6 - 20 mg/dL   Creatinine, Ser 0.70 0.44 - 1.00 mg/dL   Calcium 8.6 (L) 8.9 - 10.3 mg/dL   Total Protein 5.8 (L) 6.5 - 8.1 g/dL   Albumin 2.2 (L) 3.5 - 5.0 g/dL   AST 13 (L) 15 - 41 U/L   ALT 8 0 - 44 U/L   Alkaline Phosphatase 161 (H) 38 - 126 U/L   Total Bilirubin 0.6 0.3 - 1.2 mg/dL   GFR, Estimated >60 >60 mL/min   Anion gap 8 5 - 15      Assessment and Plan  Chronic hypertension with exacerbation during pregnancy in third trimester  - Information provided on HTN & PEC in pregnancy - IOL scheduled for 02/17/21 @ midnight >>orders placed in Epic - IOL methods recommended are: Cytotec, FB, Pitocin, AROM  Headache in pregnancy, antepartum, third trimester - Advised to take Tylenol prn H/A - Advised to return to MAU for H/A not relieved by Tylenol  [redacted] weeks gestation of pregnancy   - Discharge patient -  Keep scheduled appt at [facility] next week - Patient verbalized an understanding of the plan of care and agrees.    Laury Deep, CNM 02/08/2021, 2:05 PM

## 2021-02-08 NOTE — MAU Note (Addendum)
BP was elevated at home. Was told if her bp was above 140 or 90, she needed to come in. Was 160+/90+.  +HA, took Tylenol, no relief, little dizzy. Denies visual changes, denies epigastric pain.  Woke up swollen, hands feet and face. Denies bleeding or leaking.  Reports +FM. Irreg mild contractions.

## 2021-02-08 NOTE — Progress Notes (Signed)
Orders entered in Epic.

## 2021-02-10 LAB — CULTURE, OB URINE

## 2021-02-11 ENCOUNTER — Other Ambulatory Visit: Payer: Self-pay | Admitting: Family Medicine

## 2021-02-12 ENCOUNTER — Other Ambulatory Visit: Payer: Self-pay | Admitting: Advanced Practice Midwife

## 2021-02-13 ENCOUNTER — Ambulatory Visit: Payer: Medicaid Other

## 2021-02-14 ENCOUNTER — Encounter (HOSPITAL_COMMUNITY): Payer: Self-pay | Admitting: Obstetrics and Gynecology

## 2021-02-14 ENCOUNTER — Ambulatory Visit (HOSPITAL_BASED_OUTPATIENT_CLINIC_OR_DEPARTMENT_OTHER): Payer: Medicaid Other | Admitting: Maternal & Fetal Medicine

## 2021-02-14 ENCOUNTER — Encounter: Payer: Self-pay | Admitting: *Deleted

## 2021-02-14 ENCOUNTER — Inpatient Hospital Stay (HOSPITAL_COMMUNITY)
Admission: AD | Admit: 2021-02-14 | Discharge: 2021-02-17 | DRG: 806 | Disposition: A | Discharge status: Home / Self Care | Payer: Medicaid Other | Attending: Family Medicine | Admitting: Family Medicine

## 2021-02-14 ENCOUNTER — Other Ambulatory Visit: Payer: Self-pay | Admitting: Maternal & Fetal Medicine

## 2021-02-14 ENCOUNTER — Ambulatory Visit (HOSPITAL_BASED_OUTPATIENT_CLINIC_OR_DEPARTMENT_OTHER): Payer: Medicaid Other | Admitting: *Deleted

## 2021-02-14 ENCOUNTER — Ambulatory Visit: Payer: Medicaid Other | Admitting: *Deleted

## 2021-02-14 ENCOUNTER — Ambulatory Visit (HOSPITAL_BASED_OUTPATIENT_CLINIC_OR_DEPARTMENT_OTHER): Payer: Medicaid Other

## 2021-02-14 ENCOUNTER — Other Ambulatory Visit: Payer: Self-pay

## 2021-02-14 VITALS — BP 133/65 | HR 68

## 2021-02-14 DIAGNOSIS — O9081 Anemia of the puerperium: Secondary | ICD-10-CM | POA: Diagnosis not present

## 2021-02-14 DIAGNOSIS — O1494 Unspecified pre-eclampsia, complicating childbirth: Secondary | ICD-10-CM | POA: Diagnosis present

## 2021-02-14 DIAGNOSIS — O368130 Decreased fetal movements, third trimester, not applicable or unspecified: Secondary | ICD-10-CM

## 2021-02-14 DIAGNOSIS — O1092 Unspecified pre-existing hypertension complicating childbirth: Secondary | ICD-10-CM | POA: Diagnosis not present

## 2021-02-14 DIAGNOSIS — O26893 Other specified pregnancy related conditions, third trimester: Secondary | ICD-10-CM | POA: Diagnosis present

## 2021-02-14 DIAGNOSIS — O368390 Maternal care for abnormalities of the fetal heart rate or rhythm, unspecified trimester, not applicable or unspecified: Secondary | ICD-10-CM

## 2021-02-14 DIAGNOSIS — O99214 Obesity complicating childbirth: Secondary | ICD-10-CM | POA: Diagnosis present

## 2021-02-14 DIAGNOSIS — O99213 Obesity complicating pregnancy, third trimester: Secondary | ICD-10-CM

## 2021-02-14 DIAGNOSIS — O09523 Supervision of elderly multigravida, third trimester: Secondary | ICD-10-CM | POA: Insufficient documentation

## 2021-02-14 DIAGNOSIS — D62 Acute posthemorrhagic anemia: Secondary | ICD-10-CM | POA: Diagnosis not present

## 2021-02-14 DIAGNOSIS — O368131 Decreased fetal movements, third trimester, fetus 1: Secondary | ICD-10-CM | POA: Diagnosis not present

## 2021-02-14 DIAGNOSIS — Z3A38 38 weeks gestation of pregnancy: Secondary | ICD-10-CM | POA: Insufficient documentation

## 2021-02-14 DIAGNOSIS — O1490 Unspecified pre-eclampsia, unspecified trimester: Secondary | ICD-10-CM | POA: Diagnosis not present

## 2021-02-14 DIAGNOSIS — R638 Other symptoms and signs concerning food and fluid intake: Secondary | ICD-10-CM

## 2021-02-14 DIAGNOSIS — Z87891 Personal history of nicotine dependence: Secondary | ICD-10-CM

## 2021-02-14 DIAGNOSIS — Z20822 Contact with and (suspected) exposure to covid-19: Secondary | ICD-10-CM | POA: Diagnosis present

## 2021-02-14 DIAGNOSIS — E669 Obesity, unspecified: Secondary | ICD-10-CM

## 2021-02-14 DIAGNOSIS — Z6791 Unspecified blood type, Rh negative: Secondary | ICD-10-CM | POA: Diagnosis not present

## 2021-02-14 LAB — COMPREHENSIVE METABOLIC PANEL
ALT: 8 U/L (ref 0–44)
AST: 16 U/L (ref 15–41)
Albumin: 2.3 g/dL — ABNORMAL LOW (ref 3.5–5.0)
Alkaline Phosphatase: 184 U/L — ABNORMAL HIGH (ref 38–126)
Anion gap: 9 (ref 5–15)
BUN: 7 mg/dL (ref 6–20)
CO2: 22 mmol/L (ref 22–32)
Calcium: 8.8 mg/dL — ABNORMAL LOW (ref 8.9–10.3)
Chloride: 105 mmol/L (ref 98–111)
Creatinine, Ser: 0.69 mg/dL (ref 0.44–1.00)
GFR, Estimated: >60 mL/min (ref >60)
Glucose, Bld: 79 mg/dL (ref 70–99)
Potassium: 3.7 mmol/L (ref 3.5–5.1)
Sodium: 136 mmol/L (ref 135–145)
Total Bilirubin: 0.4 mg/dL (ref 0.3–1.2)
Total Protein: 6.1 g/dL — ABNORMAL LOW (ref 6.5–8.1)

## 2021-02-14 LAB — RESP PANEL BY RT-PCR (FLU A&B, COVID) ARPGX2
Influenza A by PCR: NEGATIVE
Influenza B by PCR: NEGATIVE
SARS Coronavirus 2 by RT PCR: NEGATIVE

## 2021-02-14 LAB — CBC
HCT: 30.7 % — ABNORMAL LOW (ref 36.0–46.0)
Hemoglobin: 9.8 g/dL — ABNORMAL LOW (ref 12.0–15.0)
MCH: 32 pg (ref 26.0–34.0)
MCHC: 31.9 g/dL (ref 30.0–36.0)
MCV: 100.3 fL — ABNORMAL HIGH (ref 80.0–100.0)
Platelets: 233 10*3/uL (ref 150–400)
RBC: 3.06 MIL/uL — ABNORMAL LOW (ref 3.87–5.11)
RDW: 14.6 % (ref 11.5–15.5)
WBC: 6.7 10*3/uL (ref 4.0–10.5)
nRBC: 0 % (ref 0.0–0.2)

## 2021-02-14 LAB — PROTEIN / CREATININE RATIO, URINE
Creatinine, Urine: 108.64 mg/dL
Protein Creatinine Ratio: 0.34 mg/mg{Cre} — ABNORMAL HIGH (ref 0.00–0.15)
Total Protein, Urine: 37 mg/dL

## 2021-02-14 LAB — TYPE AND SCREEN
ABO/RH(D): A NEG
Antibody Screen: POSITIVE

## 2021-02-14 MED ORDER — OXYCODONE-ACETAMINOPHEN 5-325 MG PO TABS: 1.0000 | ORAL_TABLET | ORAL | Status: DC | PRN

## 2021-02-14 MED ORDER — OXYTOCIN 10 UNIT/ML IJ SOLN: 10.0000 [IU] | Freq: Once | INTRAMUSCULAR | Status: DC | PRN

## 2021-02-14 MED ORDER — OXYTOCIN BOLUS FROM INFUSION
333.0000 mL | Freq: Once | INTRAVENOUS | Status: AC
  Administered 2021-02-15: 333 mL via INTRAVENOUS

## 2021-02-14 MED ORDER — OXYTOCIN-SODIUM CHLORIDE 30-0.9 UT/500ML-% IV SOLN: 2.5000 [IU]/h | INTRAVENOUS | Status: DC

## 2021-02-14 MED ORDER — OXYTOCIN-SODIUM CHLORIDE 30-0.9 UT/500ML-% IV SOLN
1.0000 m[IU]/min | INTRAVENOUS | Status: DC
  Administered 2021-02-15: 2 m[IU]/min via INTRAVENOUS
  Filled 2021-02-14: qty 500

## 2021-02-14 MED ORDER — LACTATED RINGERS IV SOLN
INTRAVENOUS | Status: DC
  Administered 2021-02-14: 900 mL via INTRAVENOUS

## 2021-02-14 MED ORDER — ONDANSETRON HCL 4 MG/2ML IJ SOLN
4.0000 mg | Freq: Four times a day (QID) | INTRAMUSCULAR | Status: DC | PRN
  Administered 2021-02-15: 4 mg via INTRAVENOUS
  Filled 2021-02-14: qty 2

## 2021-02-14 MED ORDER — FENTANYL CITRATE (PF) 100 MCG/2ML IJ SOLN
100.0000 ug | INTRAMUSCULAR | Status: DC | PRN
  Administered 2021-02-14 – 2021-02-15 (×5): 100 ug via INTRAVENOUS
  Filled 2021-02-14 (×5): qty 2

## 2021-02-14 MED ORDER — LIDOCAINE HCL (PF) 1 % IJ SOLN: 30.0000 mL | INTRAMUSCULAR | Status: DC | PRN

## 2021-02-14 MED ORDER — MISOPROSTOL 50MCG HALF TABLET
50.0000 ug | ORAL_TABLET | ORAL | Status: DC | PRN
  Administered 2021-02-14 (×2): 50 ug via BUCCAL
  Filled 2021-02-14 (×3): qty 1

## 2021-02-14 MED ORDER — OXYCODONE-ACETAMINOPHEN 5-325 MG PO TABS: 2.0000 | ORAL_TABLET | ORAL | Status: DC | PRN

## 2021-02-14 MED ORDER — LACTATED RINGERS IV SOLN
500.0000 mL | INTRAVENOUS | Status: DC | PRN
  Administered 2021-02-15: 500 mL via INTRAVENOUS

## 2021-02-14 MED ORDER — ACETAMINOPHEN 325 MG PO TABS: 650.0000 mg | ORAL_TABLET | ORAL | Status: DC | PRN

## 2021-02-14 MED ORDER — SOD CITRATE-CITRIC ACID 500-334 MG/5ML PO SOLN: 30.0000 mL | ORAL | Status: DC | PRN

## 2021-02-14 MED ORDER — TERBUTALINE SULFATE 1 MG/ML IJ SOLN: 0.2500 mg | Freq: Once | INTRAMUSCULAR | Status: DC | PRN

## 2021-02-14 NOTE — H&P (Signed)
OBSTETRIC ADMISSION HISTORY AND PHYSICAL  Crystal Lester is a 40 y.o. female [phone] with IUP at [redacted]w[redacted]d by LMP presenting for IOL due to decelerations noted on [place] and DFM. She reports no LOF, no VB, no blurry vision, headaches or peripheral edema, and RUQ pain.  She plans on breast feeding. She request post-placental IUD for birth control. She received her prenatal care at [place]: By LMP --->  Estimated Date of Delivery: 02/25/21  Sono:    @[redacted]w[redacted]d , CWD, normal anatomy, cephalic presentation,  3419F, 76% EFW   Prenatal History/Complications:  --RH negative  --Elevated BMI (50)  --Elevated blood pressure without diagnosis of gestational hypertension   Past Medical History: Past Medical History:  Diagnosis Date   Anemia    Chest pain with low risk for cardiac etiology 06/20/2019   Chest wall discomfort 06/20/2019   Depression    Hypoglycemia    Rapid palpitations 06/20/2019    Past Surgical History: Past Surgical History:  Procedure Laterality Date   BIOPSY  11/24/2019   Procedure: BIOPSY;  Surgeon: Otis Brace, MD;  Location: [facility];  Service: Gastroenterology;;   COLONOSCOPY WITH PROPOFOL N/A 11/24/2019   Procedure: COLONOSCOPY WITH PROPOFOL;  Surgeon: Otis Brace, MD;  Location: [facility];  Service: Gastroenterology;  Laterality: N/A;   POLYPECTOMY  11/24/2019   Procedure: POLYPECTOMY;  Surgeon: Otis Brace, MD;  Location: [facility];  Service: Gastroenterology;;    Obstetrical History: OB History     Gravida  5   Para  4   Term  4   Preterm      AB      Living  4      SAB      IAB      Ectopic      Multiple      Live Births  4           Social History Social History   Socioeconomic History   Marital status: Married    Spouse name: Not on file   Number of children: Not on file   Years of education: Not on file   Highest education level: Not on file  Occupational History   Not on file  Tobacco Use    Smoking status: Former    Packs/day: 0.50    Types: Cigarettes    Quit date: 2018    Years since quitting: 4.9   Smokeless tobacco: Never  Vaping Use   Vaping Use: Never used  Substance and Sexual Activity   Alcohol use: Not Currently    Comment: social   Drug use: No   Sexual activity: Yes    Partners: Male  Other Topics Concern   Not on file  Social History Narrative   Married mother of 4-from first marriage 42, 33, 24 and 39 years old. ->  All the 39 year old with her..   Current husband is disabled-lost to likes to complications of diabetes and CKD/ESRD.   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   Social Determinants of Health   Financial Resource Strain: Not on file  Food Insecurity: Not on file  Transportation Needs: Not on file  Physical Activity: Not on file  Stress: Not on file  Social Connections: Not on file    Family History: Family History  Problem Relation Age of Onset   Cancer Other    Hypertension Mother    Diabetes Mellitus II Mother  Heart attack Father 49   CAD Father    Hypertension Father    Colon cancer Father    Healthy Brother    Hypertension Paternal Grandmother    Hypertension Paternal Grandfather    Stroke Paternal Grandfather     Allergies: Allergies  Allergen Reactions   Iodine     Skin irritation   Latex Itching    Minor sensitivity   Other Nausea And Vomiting    Reaction to honey    Medications Prior to Admission  Medication Sig Dispense Refill Last Dose   ferrous sulfate 325 (65 FE) MG tablet Take 325 mg by mouth daily with breakfast.   Past Week   omeprazole (PRILOSEC OTC) 20 MG tablet Take 20 mg by mouth daily as needed (heartburn).   02/13/2021   Prenatal Vit-Fe Fumarate-FA (PRENATAL MULTIVITAMIN) TABS tablet Take 1 tablet by mouth daily at 12 noon.   Past Week   pseudoephedrine (SUDAFED) 30 MG tablet Take 30 mg by mouth every 4 (four) hours as needed for congestion.    Past Month     Review of Systems   All systems reviewed and negative except as stated in HPI  Blood pressure 131/77, pulse 77, temperature 99.7 F (37.6 C), temperature source Oral, resp. rate (!) 21, last menstrual period 05/21/2020, SpO2 100 %. General appearance: alert, cooperative, and no distress Lungs: Normal WOB  Heart: regular rate and rhythm Abdomen: soft, non-tender Pelvic: NEFG Extremities: Homans sign is negative, no sign of DVT Presentation: cephalic Fetal monitoringBaseline: 135 bpm, Variability: Good {> 6 bpm), Accelerations: Reactive, and Decelerations: Absent Uterine activityNone Dilation: Fingertip Effacement (%): Thick Station: Ballotable Exam by:: [name], MD   Prenatal labs: ABO, Rh:  A neg Antibody:  neg Rubella:  immune  RPR:   non-reactive  HBsAg:   negative  HIV:   non-reactive  GBS:   negative  1 hr Glucola passed Genetic screening  normal  Anatomy US normal   Prenatal Transfer Tool  Maternal Diabetes: No Genetic Screening: Normal Maternal Ultrasounds/Referrals: Normal Fetal Ultrasounds or other Referrals:  None Maternal Substance Abuse:  No Significant Maternal Medications:  None Significant Maternal Lab Results: Group B Strep negative  Results for orders placed or performed during the hospital encounter of 02/14/21 (from the past 24 hour(s))  CBC   Collection Time: 02/14/21  2:11 PM  Result Value Ref Range   WBC 6.7 4.0 - 10.5 K/uL   RBC 3.06 (L) 3.87 - 5.11 MIL/uL   Hemoglobin 9.8 (L) 12.0 - 15.0 g/dL   HCT 30.7 (L) 36.0 - 46.0 %   MCV 100.3 (H) 80.0 - 100.0 fL   MCH 32.0 26.0 - 34.0 pg   MCHC 31.9 30.0 - 36.0 g/dL   RDW 14.6 11.5 - 15.5 %   Platelets 233 150 - 400 K/uL   nRBC 0.0 0.0 - 0.2 %    Patient Active Problem List   Diagnosis Date Noted   Indication for care in labor and delivery, antepartum 02/14/2021    Assessment/Plan:  Crystal Lester is a 40 y.o. G5P4004 at [redacted]w[redacted]d here for IOL due to  decelerations noted during BPP today and persistent decreased fetal movement.   #Labor: Confirmed vertex with bedside US. Start with buccal cytotec, hopeful for FB placement on next check.  #Pain: Plans for IV fent and considering Nitrous, does not want epidural  #FWB: Cat 1 #ID: GBS Neg #MOF: breastfeeding  #MOC: After discussion, would like to do post-placental liletta. Will sign  consent.  #Circ: yes, inpatient   #Elevated blood pressure without hypertension diagnosis: She has had 1-2 random elevated Bps in the past several weeks, normal on arrival. Will obtain baseline pre-e labs. Asymptomatic.   #RH Neg: Rhogam eval pp.   #Grand multiparity  elevated BMI: Considering TXA at delivery.   [name], DO  02/14/2021, 2:37 PM

## 2021-02-14 NOTE — Progress Notes (Signed)
Labor Progress Note [name] is a 40 y.o. G5P4004 at [redacted]w[redacted]d presented for IOL d/t decelerations on US/DFM  S: Feeling some contractions, but not too bothersome.   O:  BP (!) 141/60   Pulse 80   Temp 99.7 F (37.6 C) (Oral)   Resp (!) 21   LMP 05/21/2020   SpO2 100%  EFM: 135/mod/15x15/none  CVE: Dilation: 1 Effacement (%): Thick Cervical Position: Posterior Station: Ballotable Presentation: Vertex Exam by:: [name], MD   A&P: 40 y.o. [id number] [redacted]w[redacted]d  #Labor: Some progession since last check. After verbal consent, attempted to place FB, however unable due to posterior location. Given second buccal cytotec and will consider re-trial of FB placement on next check.  #Pain: PRN  #FWB: Cat 1  #GBS negative  #Elevated BP at term: Suspect new diagnosis of pre-eclampsia with mild features after PCR 0.34 and elevated BP here + at [place] visit today. No symptoms. Cont to monitor, no severe ranges.   [name], DO 6:48 PM

## 2021-02-14 NOTE — Procedures (Signed)
Crystal Lester May 16, 1980 [redacted]w[redacted]d  Fetus A Non-Stress Test Interpretation for 02/14/21  Indication:  FHR deceleration during US  Fetal Heart Rate A Mode: External Baseline Rate (A): 135 bpm Variability: Moderate Accelerations: 15 x 15 Decelerations: None Multiple birth?: No  Uterine Activity Mode: Palpation, Toco Contraction Frequency (min): none Resting Tone Palpated: Relaxed  Interpretation (Fetal Testing) Nonstress Test Interpretation: Reactive Overall Impression: Reassuring for gestational age Comments: Dr. Gertie Exon reviewed tracing

## 2021-02-14 NOTE — Progress Notes (Signed)
MFM Brief Note  Crystal Lester is a at 69w 2d G5P4 who is here for antenatal testing at the [facility] of [place].  A biophyiscal profile was performed of 8/8 however, there were two prolonged declerations audibly witnessed.  She subsquently had an NST that was reactive.  In speaking with Ms. Sabra Heck she reports decreased fetal movement and had plans for delivery on Sunday.  Given today's events I recommend she go to L&D for delivery today.  All questions answered and she was in agreement.  I spoke with Dr. Rosana Hoes who was first attending for the faculty practice.  I spent 20 minutes with > 50% in face to face consultations.  Vikki Ports, MD.

## 2021-02-14 NOTE — Progress Notes (Addendum)
Labor Progress Note Crystal Lester is a 40 y.o. G5P4004 at [redacted]w[redacted]d presented for IOL d/t decelerations on US/DFM  S: Comfortable, feeling mild contractions  O:  BP 127/60 (BP Location: Right Arm)   Pulse 69   Temp 97.8 F (36.6 C) (Oral)   Resp 18   Ht 5\' 8"  (1.727 m)   Wt (!) 152.6 kg   LMP 05/21/2020   SpO2 100%   BMI 51.16 kg/m  EFM: 150/mod/15x15/none  CVE: Dilation: 1 Effacement (%): Thick Cervical Position: Posterior Station: Ballotable Presentation: Vertex Exam by:: Mylo Red, CNM  A&P: 40 y.o. [id number] [redacted]w[redacted]d  #Labor:FB placed and dosed cytotec. Continue to redose cytotec every 4 hours until balloon is out. Consider AROM after FB is out.  #Pain: PRN  #FWB: Cat 1  #GBS negative  #Pre-E- BP mild range, no symptoms. Continue to monitor.   Layla Barter, MD PGY-2 11:23 PM

## 2021-02-15 ENCOUNTER — Inpatient Hospital Stay (HOSPITAL_COMMUNITY): Payer: Medicaid Other | Admitting: Anesthesiology

## 2021-02-15 ENCOUNTER — Encounter (HOSPITAL_COMMUNITY): Payer: Self-pay | Admitting: Obstetrics and Gynecology

## 2021-02-15 DIAGNOSIS — O1092 Unspecified pre-existing hypertension complicating childbirth: Secondary | ICD-10-CM

## 2021-02-15 DIAGNOSIS — Z3A38 38 weeks gestation of pregnancy: Secondary | ICD-10-CM

## 2021-02-15 DIAGNOSIS — O368131 Decreased fetal movements, third trimester, fetus 1: Secondary | ICD-10-CM

## 2021-02-15 LAB — RPR: RPR Ser Ql: NONREACTIVE

## 2021-02-15 MED ORDER — PHENYLEPHRINE 40 MCG/ML (10ML) SYRINGE FOR IV PUSH (FOR BLOOD PRESSURE SUPPORT): 80.0000 ug | PREFILLED_SYRINGE | INTRAVENOUS | Status: DC | PRN

## 2021-02-15 MED ORDER — TRANEXAMIC ACID-NACL 1000-0.7 MG/100ML-% IV SOLN: 1000.0000 mg | Freq: Once | INTRAVENOUS | Status: AC

## 2021-02-15 MED ORDER — MISOPROSTOL 200 MCG PO TABS
1000.0000 ug | ORAL_TABLET | Freq: Once | ORAL | Status: AC
  Administered 2021-02-15: 1000 ug via RECTAL

## 2021-02-15 MED ORDER — EPHEDRINE 5 MG/ML INJ: 10.0000 mg | INTRAVENOUS | Status: DC | PRN

## 2021-02-15 MED ORDER — DIPHENHYDRAMINE HCL 50 MG/ML IJ SOLN: 12.5000 mg | INTRAMUSCULAR | Status: DC | PRN

## 2021-02-15 MED ORDER — FENTANYL-BUPIVACAINE-NACL 0.5-0.125-0.9 MG/250ML-% EP SOLN
12.0000 mL/h | EPIDURAL | Status: DC | PRN
  Administered 2021-02-15: 12 mL/h via EPIDURAL
  Filled 2021-02-15: qty 250

## 2021-02-15 MED ORDER — TRANEXAMIC ACID-NACL 1000-0.7 MG/100ML-% IV SOLN
INTRAVENOUS | Status: AC
  Administered 2021-02-15: 1000 mg via INTRAVENOUS
  Filled 2021-02-15: qty 100

## 2021-02-15 MED ORDER — MISOPROSTOL 200 MCG PO TABS
ORAL_TABLET | ORAL | Status: AC
  Filled 2021-02-15: qty 5

## 2021-02-15 MED ORDER — LEVONORGESTREL 20.1 MCG/DAY IU IUD
1.0000 | INTRAUTERINE_SYSTEM | Freq: Once | INTRAUTERINE | Status: DC
  Filled 2021-02-15: qty 1

## 2021-02-15 MED ORDER — LACTATED RINGERS IV SOLN
500.0000 mL | Freq: Once | INTRAVENOUS | Status: AC
  Administered 2021-02-15: 500 mL via INTRAVENOUS

## 2021-02-15 MED ORDER — LIDOCAINE HCL (PF) 1 % IJ SOLN
INTRAMUSCULAR | Status: DC | PRN
  Administered 2021-02-15 (×2): 5 mL via EPIDURAL
  Administered 2021-02-15: 2 mL via EPIDURAL

## 2021-02-15 NOTE — Progress Notes (Signed)
Patient ID: Crystal Lester, female   DOB: [birth year], 40 y.o.   MRN: [id number]  Labor Progress Note Crystal Lester is a 40 y.o. [id number] at [redacted]w[redacted]d presented for IOL for DFM with prolonged decel in the office  S:  Pt napping, has been switching positions and walking the halls. Reports contractions are stronger and closer together but not feeling any additional pressure.  O:  BP 116/60   Pulse 68   Temp 97.9 F (36.6 C) (Oral)   Resp 18   Ht 5\' 8"  (1.727 m)   Wt (!) 336 lb 8 oz (152.6 kg)   LMP 05/21/2020   SpO2 100%   BMI 51.16 kg/m  EFM: baseline 140 bpm/ moderate variability/ 15x15 accels/ no decels  Toco/IUPC: q30min SVE: Dilation: 4 Effacement (%): 50 Cervical Position: Posterior Station: -3 Presentation: Vertex Exam by:: Dr Nelda Marseille Pitocin: 22 mu/min  A/P: 40 y.o. [id number] [redacted]w[redacted]d  1. Labor: Latent 2. FWB: Cat 1 3. Pain: well-controlled with deep breathing 4. Attempted AROM but baby too high. Requested Dr. Nelda Marseille to bedside to attempt AROM with controlled descent of head, she was successful with clear return of fluid.  Continue to titrate pitocin. Pt repositioned to throne with knees together/ankles out. Anticipate SVD.  Gabriel Carina, CNM 3:44 PM

## 2021-02-15 NOTE — Anesthesia Preprocedure Evaluation (Signed)
Anesthesia Evaluation  Patient identified by MRN, date of birth, ID band Patient awake    Reviewed: Allergy & Precautions, Patient's Chart, lab work & pertinent test results  Airway Mallampati: II  TM Distance: >3 FB Neck ROM: Full    Dental no notable dental hx. (+) Teeth Intact   Pulmonary former smoker,    Pulmonary exam normal breath sounds clear to auscultation       Cardiovascular Normal cardiovascular exam Rhythm:Regular Rate:Normal  Palpitations   Neuro/Psych PSYCHIATRIC DISORDERS Depression negative neurological ROS     GI/Hepatic Neg liver ROS, GERD  Medicated,  Endo/Other  Morbid obesity  Renal/GU negative Renal ROS  negative genitourinary   Musculoskeletal negative musculoskeletal ROS (+)   Abdominal (+) + obese,   Peds  Hematology  (+) anemia ,   Anesthesia Other Findings   Reproductive/Obstetrics (+) Pregnancy 38 4/7 weeks                             Anesthesia Physical Anesthesia Plan  ASA: 3  Anesthesia Plan: Epidural   Post-op Pain Management:    Induction:   PONV Risk Score and Plan:   Airway Management Planned: Natural Airway  Additional Equipment:   Intra-op Plan:   Post-operative Plan:   Informed Consent: I have reviewed the patients History and Physical, chart, labs and discussed the procedure including the risks, benefits and alternatives for the proposed anesthesia with the patient or authorized representative who has indicated his/her understanding and acceptance.       Plan Discussed with: Anesthesiologist  Anesthesia Plan Comments:         Anesthesia Quick Evaluation

## 2021-02-15 NOTE — Progress Notes (Signed)
Labor Progress Note Crystal Lester is a 40 y.o. [id number] at [redacted]w[redacted]d presented for IOL d/t decelerations on US/DFM  S: Doing well, resting comfortably.  O:  BP 134/72   Pulse 77   Temp 99.1 F (37.3 C) (Oral)   Resp 18   Ht 5\' 8"  (1.727 m)   Wt (!) 152.6 kg   LMP 05/21/2020   SpO2 100%   BMI 51.16 kg/m  EFM: 125/mod/+accels, no decels  CVE: Dilation: 4 Effacement (%): 90 Cervical Position: Posterior Station: Ballotable Presentation: Vertex Exam by:: Mylo Red, CNM   A&P: 41 y.o. [id number] [redacted]w[redacted]d  #Labor: Progressing well. Pit @ 12, continue titration. #Pain: PRN #FWB: Cat 1 #GBS negative  #Pre-E: mild range BP, no symptoms. Continue to monitor.  Kristeen Miss, MD 9:54 AM

## 2021-02-15 NOTE — Progress Notes (Signed)
Labor Progress Note Crystal Lester is a 40 y.o. O8N8676 at [redacted]w[redacted]d presented for IOL d/t decelerations on US/DFM  S: Feeling comfortable, family in the room. No concerns.   O:  BP (!) 136/55   Pulse 72   Temp 98.8 F (37.1 C) (Oral)   Resp 18   Ht 5\' 8"  (1.727 m)   Wt (!) 152.6 kg   LMP 05/21/2020   SpO2 100%   BMI 51.16 kg/m  EFM: 125/mod/15x15/none  Dilation: 4 Effacement (%): 90 Cervical Position: Posterior Station: Ballotable Presentation: Vertex Exam by:: Mylo Red, CNM   A&P: 40 y.o. [id number] [redacted]w[redacted]d  #Labor: FB out. Contraction pattern very irregular and not painful.  Will start pitocin and titrate to complete.  #Pain: PRN  #FWB: Cat 1  #GBS negative  #Pre-E- BP mild range, no symptoms. Continue to monitor.   Layla Barter, MD PGY-2 4:38 AM

## 2021-02-15 NOTE — Anesthesia Procedure Notes (Signed)
Epidural Patient location during procedure: OB Start time: 02/15/2021 6:50 PM End time: 02/15/2021 7:14 PM  Staffing Anesthesiologist: Josephine Igo, MD Performed: anesthesiologist   Preanesthetic Checklist Completed: patient identified, IV checked, site marked, risks and benefits discussed, surgical consent, monitors and equipment checked, pre-op evaluation and timeout performed  Epidural Patient position: sitting Prep: DuraPrep and site prepped and draped Patient monitoring: continuous pulse ox and blood pressure Approach: midline Location: L3-L4 Injection technique: LOR air  Needle:  Needle type: Tuohy  Needle gauge: 17 G Needle length: 9 cm and 9 Needle insertion depth: 10 cm Catheter type: closed end flexible Catheter size: 19 Gauge Catheter at skin depth: 15 cm Test dose: negative and Other  Assessment Events: blood not aspirated, injection not painful, no injection resistance, no paresthesia and negative IV test  Additional Notes Patient identified. Risks and benefits discussed including failed block, incomplete  Pain control, post dural puncture headache, nerve damage, paralysis, blood pressure Changes, nausea, vomiting, reactions to medications-both toxic and allergic and post Partum back pain. All questions were answered. Patient expressed understanding and wished to proceed. Sterile technique was used throughout procedure. Epidural site was Dressed with sterile barrier dressing. No paresthesias, signs of intravascular injection Or signs of intrathecal spread were encountered. Technically difficult due to poor landmarks and super MO. Attempt x 2. Patient was more comfortable after the epidural was dosed. Please see RN's note for documentation of vital signs and FHR which are stable. Reason for block:procedure for pain

## 2021-02-16 ENCOUNTER — Encounter (HOSPITAL_COMMUNITY): Payer: Self-pay | Admitting: Obstetrics and Gynecology

## 2021-02-16 LAB — CBC
HCT: 24.4 % — ABNORMAL LOW (ref 36.0–46.0)
HCT: 30.2 % — ABNORMAL LOW (ref 36.0–46.0)
Hemoglobin: 7.9 g/dL — ABNORMAL LOW (ref 12.0–15.0)
Hemoglobin: 9.9 g/dL — ABNORMAL LOW (ref 12.0–15.0)
MCH: 32.1 pg (ref 26.0–34.0)
MCH: 32.6 pg (ref 26.0–34.0)
MCHC: 32.4 g/dL (ref 30.0–36.0)
MCHC: 32.8 g/dL (ref 30.0–36.0)
MCV: 99.2 fL (ref 80.0–100.0)
MCV: 99.3 fL (ref 80.0–100.0)
Platelets: 182 10*3/uL (ref 150–400)
Platelets: 219 10*3/uL (ref 150–400)
RBC: 2.46 MIL/uL — ABNORMAL LOW (ref 3.87–5.11)
RBC: 3.04 MIL/uL — ABNORMAL LOW (ref 3.87–5.11)
RDW: 14.4 % (ref 11.5–15.5)
RDW: 14.6 % (ref 11.5–15.5)
WBC: 15.3 10*3/uL — ABNORMAL HIGH (ref 4.0–10.5)
WBC: 16.5 10*3/uL — ABNORMAL HIGH (ref 4.0–10.5)
nRBC: 0 % (ref 0.0–0.2)
nRBC: 0 % (ref 0.0–0.2)

## 2021-02-16 MED ORDER — FUROSEMIDE 20 MG PO TABS
20.0000 mg | ORAL_TABLET | Freq: Two times a day (BID) | ORAL | Status: DC
  Administered 2021-02-17: 20 mg via ORAL
  Filled 2021-02-16: qty 1

## 2021-02-16 MED ORDER — LACTATED RINGERS IV SOLN: INTRAVENOUS | Status: DC

## 2021-02-16 MED ORDER — LACTATED RINGERS IV BOLUS: 1000.0000 mL | Freq: Once | INTRAVENOUS | Status: AC

## 2021-02-16 MED ORDER — SENNOSIDES-DOCUSATE SODIUM 8.6-50 MG PO TABS
2.0000 | ORAL_TABLET | Freq: Every day | ORAL | Status: DC
  Administered 2021-02-17: 2 via ORAL
  Filled 2021-02-16: qty 2

## 2021-02-16 MED ORDER — ONDANSETRON HCL 4 MG PO TABS: 4.0000 mg | ORAL_TABLET | ORAL | Status: DC | PRN

## 2021-02-16 MED ORDER — ACETAMINOPHEN 325 MG PO TABS: 650.0000 mg | ORAL_TABLET | ORAL | Status: DC | PRN

## 2021-02-16 MED ORDER — SIMETHICONE 80 MG PO CHEW: 80.0000 mg | CHEWABLE_TABLET | ORAL | Status: DC | PRN

## 2021-02-16 MED ORDER — DIBUCAINE (PERIANAL) 1 % EX OINT: 1.0000 "application " | TOPICAL_OINTMENT | CUTANEOUS | Status: DC | PRN

## 2021-02-16 MED ORDER — PRENATAL MULTIVITAMIN CH
1.0000 | ORAL_TABLET | Freq: Every day | ORAL | Status: DC
  Administered 2021-02-16 – 2021-02-17 (×2): 1 via ORAL
  Filled 2021-02-16 (×2): qty 1

## 2021-02-16 MED ORDER — RHO D IMMUNE GLOBULIN 1500 UNIT/2ML IJ SOSY
300.0000 ug | PREFILLED_SYRINGE | Freq: Once | INTRAMUSCULAR | Status: AC
Start: 2021-02-16 — End: 2021-02-16
  Administered 2021-02-16: 300 ug via INTRAVENOUS
  Filled 2021-02-16: qty 2

## 2021-02-16 MED ORDER — OXYCODONE HCL 5 MG PO TABS
5.0000 mg | ORAL_TABLET | Freq: Four times a day (QID) | ORAL | Status: AC | PRN
  Administered 2021-02-16 – 2021-02-17 (×3): 5 mg via ORAL
  Filled 2021-02-16 (×3): qty 1

## 2021-02-16 MED ORDER — SODIUM CHLORIDE 0.9 % IV SOLN
500.0000 mg | Freq: Once | INTRAVENOUS | Status: AC
  Administered 2021-02-16: 500 mg via INTRAVENOUS
  Filled 2021-02-16: qty 25

## 2021-02-16 MED ORDER — WITCH HAZEL-GLYCERIN EX PADS: 1.0000 "application " | MEDICATED_PAD | CUTANEOUS | Status: DC | PRN

## 2021-02-16 MED ORDER — BENZOCAINE-MENTHOL 20-0.5 % EX AERO
1.0000 "application " | INHALATION_SPRAY | CUTANEOUS | Status: DC | PRN
  Administered 2021-02-16: 1 via TOPICAL
  Filled 2021-02-16: qty 56

## 2021-02-16 MED ORDER — ONDANSETRON HCL 4 MG/2ML IJ SOLN: 4.0000 mg | INTRAMUSCULAR | Status: DC | PRN

## 2021-02-16 MED ORDER — DIPHENHYDRAMINE HCL 25 MG PO CAPS: 25.0000 mg | ORAL_CAPSULE | Freq: Four times a day (QID) | ORAL | Status: DC | PRN

## 2021-02-16 MED ORDER — IBUPROFEN 600 MG PO TABS
600.0000 mg | ORAL_TABLET | Freq: Four times a day (QID) | ORAL | Status: DC
  Administered 2021-02-16 – 2021-02-17 (×7): 600 mg via ORAL
  Filled 2021-02-16 (×7): qty 1

## 2021-02-16 MED ORDER — COCONUT OIL OIL: 1.0000 "application " | TOPICAL_OIL | Status: DC | PRN

## 2021-02-16 NOTE — Discharge Summary (Signed)
Postpartum Discharge Summary     Patient Name: Crystal Lester DOB: Dec 17, 1980 MRN: [id number]  Date of admission: 02/14/2021 Delivery date:02/15/2021  Delivering provider: Genia Lester  Date of discharge: 02/17/2021  Admitting diagnosis: Chronic hypertension affecting pregnancy [O10.919] Intrauterine pregnancy: [redacted]w[redacted]d     Secondary diagnosis:  Principal Problem:   Vaginal delivery Active Problems:   Postpartum hemorrhage   Pre-eclampsia  Additional problems: Rh negative status, maternal obesity (BMI 51), acute blood loss anemia (s/p IV Venofer)    Discharge diagnosis: Term Pregnancy Delivered                                              Post partum procedures: None Augmentation: AROM, Pitocin, Cytotec, and IP Foley Complications: [id number]  Hospital course: Induction of Labor With Vaginal Delivery   40 y.o. yo G5P5005 at [redacted]w[redacted]d was admitted to the hospital 02/14/2021 for induction of labor.  Indication for induction:  Decreased fetal movement and NRFHT .  Patient had an uncomplicated labor course. She received Cytotec followed by Cook's catheter placement to start her induction followed by Pitocin and AROM until she progressed to complete. She had a normal vaginal delivery complicated by a postpartum hemorrhage of 1002 cc.  Membrane Rupture Time/Date: 2:44 PM ,02/15/2021   Delivery Method:Vaginal, Spontaneous  Episiotomy: None  Lacerations:  None  Details of delivery can be found in separate delivery note.  Patient had a routine postpartum course. Her hemoglobin postpartum was 7.9 for which she received IV Venofer. She remained asymptomatic from her anemia while inpatient. She is eating, drinking, ambulating, and voiding without issue. She is meeting all postpartum milestones. Patient is discharged home 02/17/21.  Newborn Data: Birth date:02/15/2021  Birth time:11:08 PM  Gender:Female  Living status:Living  Apgars:9 ,9  Weight:3390 g   Magnesium Sulfate  received: No BMZ received: No Rhophylac: Yes - given postpartum  MMR: N/A T-DaP: Given prenatally Flu: Given prenatally  Transfusion: No  Physical exam  Vitals:   02/16/21 0627 02/16/21 1242 02/16/21 2055 02/17/21 0606  BP: 114/68 116/65 120/66 132/65  Pulse: 75 72 73 70  Resp: 16  18   Temp: 98.7 F (37.1 C) 99 F (37.2 C) 98.5 F (36.9 C) 98.3 F (36.8 C)  TempSrc: Oral  Oral Oral  SpO2: 100%  99% 100%  Weight:      Height:       General: alert, cooperative, and no distress Lochia: appropriate Uterine Fundus: firm and below umbilicus  DVT Evaluation: 1+ pitting edema to LEs bilaterally, no calf tenderness to palpation   Labs: Lab Results  Component Value Date   WBC 15.3 (H) 02/16/2021   HGB 7.9 (L) 02/16/2021   HCT 24.4 (L) 02/16/2021   MCV 99.2 02/16/2021   PLT 182 02/16/2021   CMP Latest Ref Rng & Units 02/14/2021  Glucose 70 - 99 mg/dL 79  BUN 6 - 20 mg/dL 7  Creatinine 0.44 - 1.00 mg/dL 0.69  Sodium 135 - 145 mmol/L 136  Potassium 3.5 - 5.1 mmol/L 3.7  Chloride 98 - 111 mmol/L 105  CO2 22 - 32 mmol/L 22  Calcium 8.9 - 10.3 mg/dL 8.8(L)  Total Protein 6.5 - 8.1 g/dL 6.1(L)  Total Bilirubin 0.3 - 1.2 mg/dL 0.4  Alkaline Phos 38 - 126 U/L 184(H)  AST 15 - 41 U/L 16  ALT 0 -  44 U/L 8   Edinburgh Score: Edinburgh Postnatal Depression Scale Screening Tool 02/17/2021  I have been able to laugh and see the funny side of things. 0  I have looked forward with enjoyment to things. 0  I have blamed myself unnecessarily when things went wrong. 1  I have been anxious or worried for no good reason. 0  I have felt scared or panicky for no good reason. 0  Things have been getting on top of me. 0  I have been so unhappy that I have had difficulty sleeping. 0  I have felt sad or miserable. 0  I have been so unhappy that I have been crying. 0  The thought of harming myself has occurred to me. 0  Edinburgh Postnatal Depression Scale Total 1     After visit meds:   Allergies as of 02/17/2021       Reactions   Iodine    Skin irritation   Latex Itching   Minor sensitivity   Other Nausea And Vomiting   Reaction to honey        Medication List     STOP taking these medications    pseudoephedrine 30 MG tablet Commonly known as: SUDAFED       TAKE these medications    acetaminophen 500 MG tablet Commonly known as: TYLENOL Take 2 tablets (1,000 mg total) by mouth every 8 (eight) hours as needed (pain).   ferrous sulfate 325 (65 FE) MG tablet Take 325 mg by mouth daily with breakfast.   furosemide 20 MG tablet Commonly known as: LASIX Take 1 tablet (20 mg total) by mouth 2 (two) times daily for 3 days.   ibuprofen 600 MG tablet Commonly known as: ADVIL Take 1 tablet (600 mg total) by mouth every 6 (six) hours as needed (pain).   omeprazole 20 MG tablet Commonly known as: PRILOSEC OTC Take 20 mg by mouth daily as needed (heartburn).   prenatal multivitamin Tabs tablet Take 1 tablet by mouth daily at 12 noon.         Discharge home in stable condition Infant Feeding: Breast Infant Disposition: home with mother Discharge instruction: per After Visit Summary and Postpartum booklet. Activity: Advance as tolerated. Pelvic rest for 6 weeks.  Diet: routine diet  Follow up Visit: Patient received her prenatal [facility] at [facility]. She was advised to call and make her postpartum visit in 4-6 weeks.   Additional Postpartum F/U: BP check in one week. Message sent to [facility] on 02/17/21 to schedule this for patient.   High risk pregnancy complicated by: Pre-eclampsia Delivery mode:  Vaginal, Spontaneous  Anticipated Birth Control:  IUD outpatient (at health department)   02/17/2021 Crystal Del, MD

## 2021-02-16 NOTE — Progress Notes (Signed)
POSTPARTUM PROGRESS NOTE  Post Partum Day 1  Subjective:  Crystal Lester is a 40 y.o. [id number] s/p VD at [redacted]w[redacted]d.  She reports she is doing well. No acute events overnight. She denies any problems with ambulating, voiding or po intake. Denies nausea or vomiting.  Pain is well controlled.  Lochia is mild.  Objective: Blood pressure 116/65, pulse 72, temperature 99 F (37.2 C), resp. rate 16, height 5\' 8"  (1.727 m), weight (!) 152.6 kg, last menstrual period 05/21/2020, SpO2 100 %, unknown if currently breastfeeding.  Physical Exam:  General: alert, cooperative and no distress Chest: no respiratory distress Heart:regular rate, distal pulses intact Uterine Fundus: firm, appropriately tender DVT Evaluation: No calf swelling or tenderness Extremities: 2+ pitting edema bilaterally to mid shin Skin: warm, dry  Recent Labs    02/15/21 2346 02/16/21 0441  HGB 9.9* 7.9*  HCT 30.2* 24.4*    Assessment/Plan: Crystal Lester is a 40 y.o. X2K2081 s/p VD at [redacted]w[redacted]d   PPD#1 - Doing well  Routine postpartum care  #Mild Pre-e in this pregnancy, 2+ LE edema: Plan for start lasix on PPD#2, will monitor BP which is currently WNL.   #Acute blood loss anemia: Hgb 9.9>7.9. Asymptomatic. Will give IV venofer, consented with patient prior to order.   Contraception: IUD at postpartum visit  Feeding: Breast Dispo: Plan for discharge tomorrow.   LOS: 2 days   [name], DO  OB Fellow  02/16/2021, 3:27 PM

## 2021-02-17 ENCOUNTER — Inpatient Hospital Stay (HOSPITAL_COMMUNITY): Payer: Medicaid Other

## 2021-02-17 ENCOUNTER — Inpatient Hospital Stay (HOSPITAL_COMMUNITY)
Admission: AD | Admit: 2021-02-17 | Payer: Medicaid Other | Source: Home / Self Care | Admitting: Obstetrics & Gynecology

## 2021-02-17 DIAGNOSIS — O1490 Unspecified pre-eclampsia, unspecified trimester: Secondary | ICD-10-CM | POA: Diagnosis not present

## 2021-02-17 MED ORDER — FUROSEMIDE 20 MG PO TABS: 20.0000 mg | ORAL_TABLET | Freq: Two times a day (BID) | ORAL | 0 refills | Status: DC

## 2021-02-17 MED ORDER — ACETAMINOPHEN 500 MG PO TABS: 1000.0000 mg | ORAL_TABLET | Freq: Three times a day (TID) | ORAL | 0 refills | Status: DC | PRN

## 2021-02-17 MED ORDER — IBUPROFEN 600 MG PO TABS: 600.0000 mg | ORAL_TABLET | Freq: Four times a day (QID) | ORAL | 0 refills | Status: DC | PRN

## 2021-02-17 NOTE — Lactation Note (Signed)
This note was copied from a baby's chart. Lactation Consultation Note  Patient Name: Crystal Lester Today's Date: 02/17/2021 Reason for consult: Initial assessment;Early term 37-38.6wks;Infant weight loss;Other (Comment) (4 % weight loss / post circ / per mom sleepy/ per mom experienced BF and breast feeding is going well / breast feeding is comfortable. LC reviewed BF D/C teaching . mom aware of [facility] resources after D/C.) Age:39 hours  Maternal Data Has patient been taught Hand Expression?:  (per mom experienced)  Feeding Mother's Current Feeding Choice: Breast Milk  LATCH Score                    Lactation Tools Discussed/Used Tools: Pump;Flanges Flange Size: 24;27 Breast pump type: Manual Pump Education: Milk Storage;Setup, frequency, and cleaning  Interventions Interventions: Breast feeding basics reviewed;Education;Hand pump;LC Services brochure  Discharge Discharge Education: Engorgement and breast care;Warning signs for feeding baby Pump: Manual;Personal  Consult Status Consult Status: Complete Date: 02/17/21    Myer Haff 02/17/2021, 1:29 PM

## 2021-02-17 NOTE — Anesthesia Postprocedure Evaluation (Signed)
Anesthesia Post Note  Patient: Crystal Lester  Procedure(s) Performed: AN AD [place]     Patient location during evaluation: Mother Baby Anesthesia Type: Epidural Level of consciousness: awake and alert Pain management: pain level controlled Vital Signs Assessment: post-procedure vital signs reviewed and stable Respiratory status: spontaneous breathing and respiratory function stable Cardiovascular status: stable Postop Assessment: no headache, no backache, no apparent nausea or vomiting and able to ambulate Anesthetic complications: no   No notable events documented.  Last Vitals:  Vitals:   02/16/21 2055 02/17/21 0606  BP: 120/66 132/65  Pulse: 73 70  Resp: 18   Temp: 36.9 C 36.8 C  SpO2: 99% 100%    Last Pain:  Vitals:   02/17/21 0952  TempSrc:   PainSc: 0-No pain   Pain Goal: Patients Stated Pain Goal: 0 (02/15/21 0126)              Epidural/Spinal Function Cutaneous sensation: Normal sensation (02/17/21 0952)  Phebe Colla

## 2021-02-18 LAB — RH IG WORKUP (INCLUDES ABO/RH)
Fetal Screen: NEGATIVE
Gestational Age(Wks): 38.4
Unit division: 0

## 2021-02-19 ENCOUNTER — Ambulatory Visit: Payer: Medicaid Other

## 2021-02-19 NOTE — Addendum Note (Signed)
Addended by: Vikki Ports on: 02/19/2021 10:11 AM   Modules accepted: Level of Service

## 2021-02-20 ENCOUNTER — Ambulatory Visit: Payer: Medicaid Other

## 2021-02-28 ENCOUNTER — Telehealth (HOSPITAL_COMMUNITY): Payer: Self-pay

## 2021-02-28 NOTE — Telephone Encounter (Signed)
No answer. Left message to return nurse call.  Sharyn Lull [facility] 02/28/2021,1646

## 2022-02-13 ENCOUNTER — Other Ambulatory Visit (HOSPITAL_COMMUNITY): Payer: Self-pay | Admitting: Family Medicine

## 2022-02-13 DIAGNOSIS — R198 Other specified symptoms and signs involving the digestive system and abdomen: Secondary | ICD-10-CM

## 2022-02-13 DIAGNOSIS — K869 Disease of pancreas, unspecified: Secondary | ICD-10-CM

## 2022-02-25 ENCOUNTER — Encounter (HOSPITAL_COMMUNITY)
Admission: RE | Admit: 2022-02-25 | Discharge: 2022-02-25 | Disposition: A | Discharge status: Home / Self Care | Payer: Medicaid Other | Source: Ambulatory Visit | Attending: Family Medicine | Admitting: Family Medicine

## 2022-02-25 ENCOUNTER — Ambulatory Visit (HOSPITAL_COMMUNITY)
Admission: RE | Admit: 2022-02-25 | Discharge: 2022-02-25 | Disposition: A | Discharge status: Home / Self Care | Payer: Medicaid Other | Source: Ambulatory Visit | Attending: Family Medicine | Admitting: Family Medicine

## 2022-02-25 ENCOUNTER — Encounter (HOSPITAL_COMMUNITY): Payer: Self-pay | Admitting: Radiology

## 2022-02-25 DIAGNOSIS — R198 Other specified symptoms and signs involving the digestive system and abdomen: Secondary | ICD-10-CM | POA: Insufficient documentation

## 2022-02-25 DIAGNOSIS — K869 Disease of pancreas, unspecified: Secondary | ICD-10-CM | POA: Diagnosis present

## 2022-02-25 MED ORDER — TECHNETIUM TC 99M MEBROFENIN IV KIT
5.2000 | PACK | Freq: Once | INTRAVENOUS | Status: AC | PRN
  Administered 2022-02-25: 5.2 via INTRAVENOUS

## 2022-02-25 MED ORDER — IOHEXOL 350 MG/ML SOLN
80.0000 mL | Freq: Once | INTRAVENOUS | Status: AC | PRN
  Administered 2022-02-25: 80 mL via INTRAVENOUS

## 2022-06-20 IMAGING — US US MFM FETAL BPP W/O NON-STRESS
1 series · 13 of 28 positions shown · non-contrast
Comparison: none

[Series 1: us mfm fetal bpp w/o non-stress · 42 acquisitions, 13 frames shown]
[im 2/42]
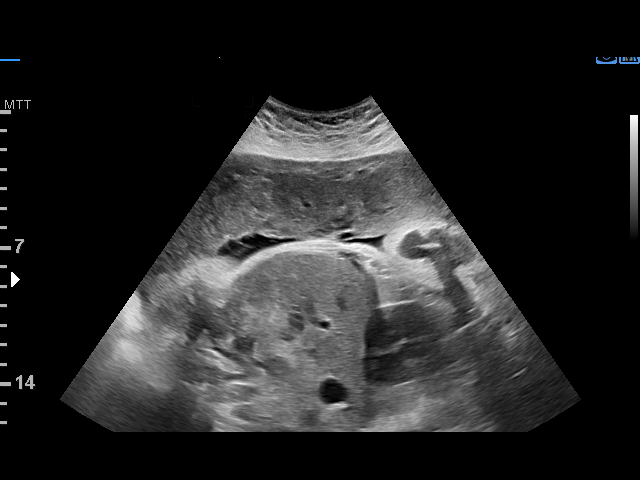
[im 5/42]
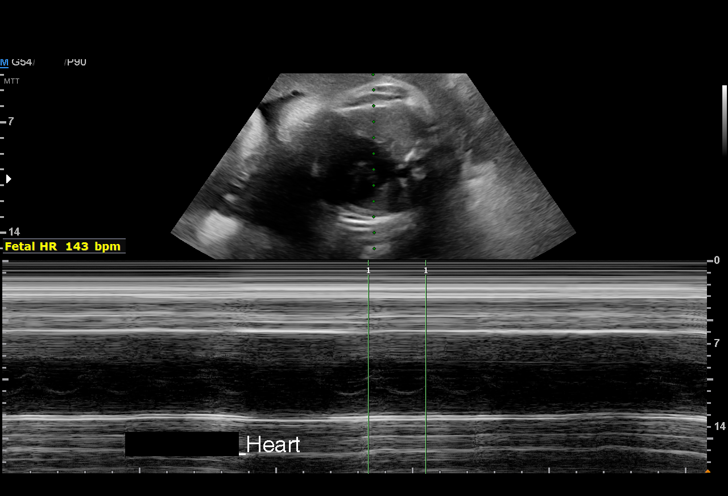
[im 8/42]
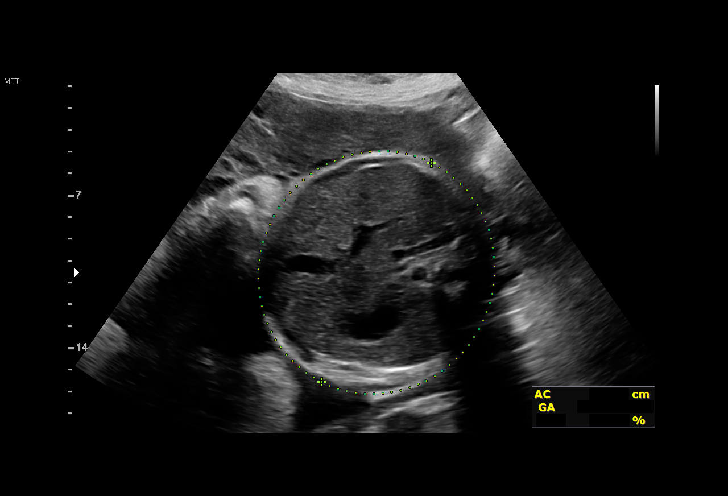
[im 11/42]
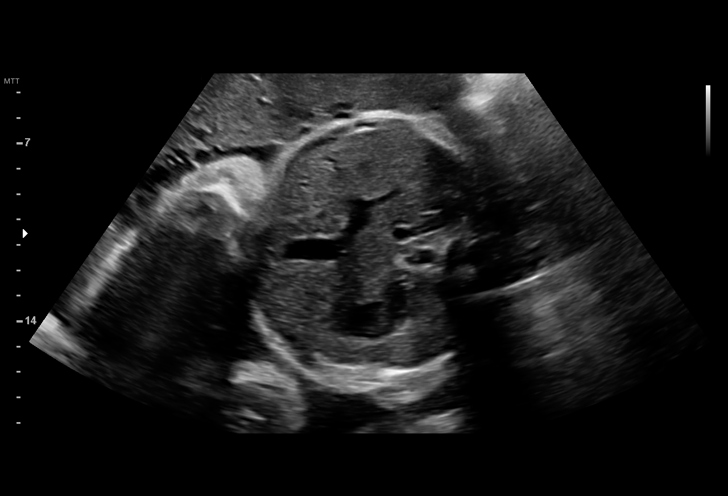
[im 14/42]
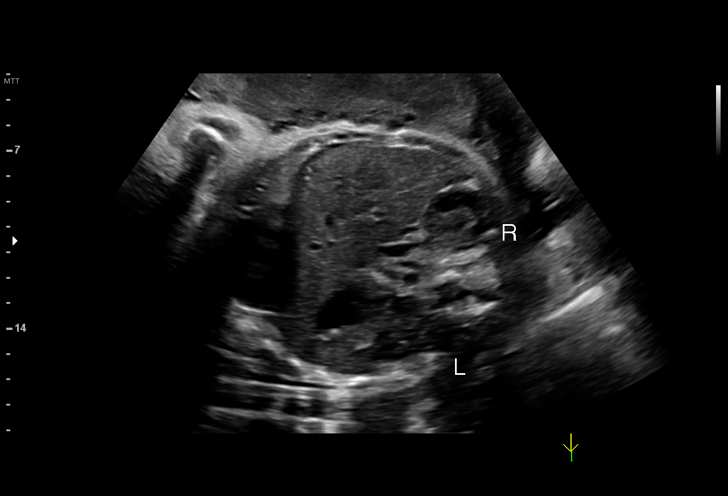
[im 17/42]
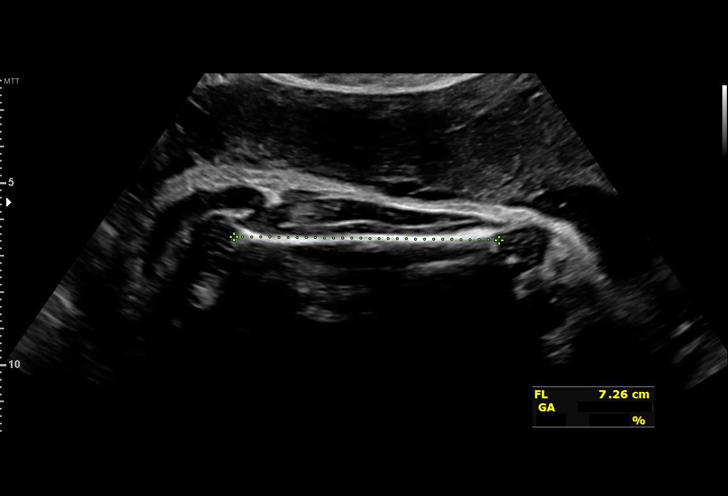
[im 22/42]
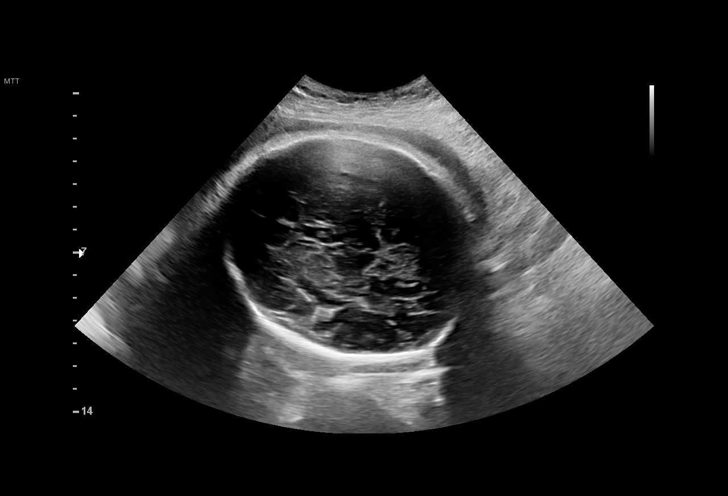
[im 25/42]
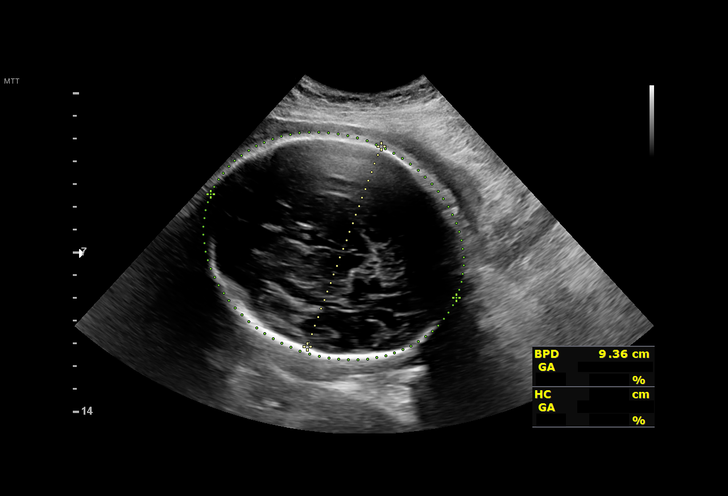
[im 28/42]
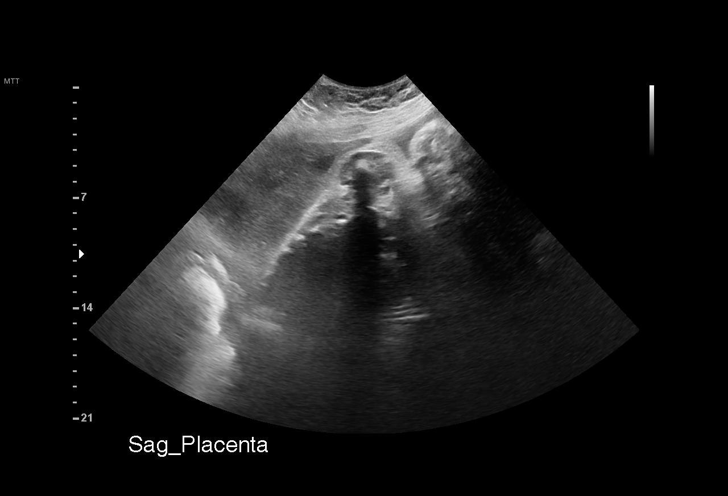
[im 31/42]
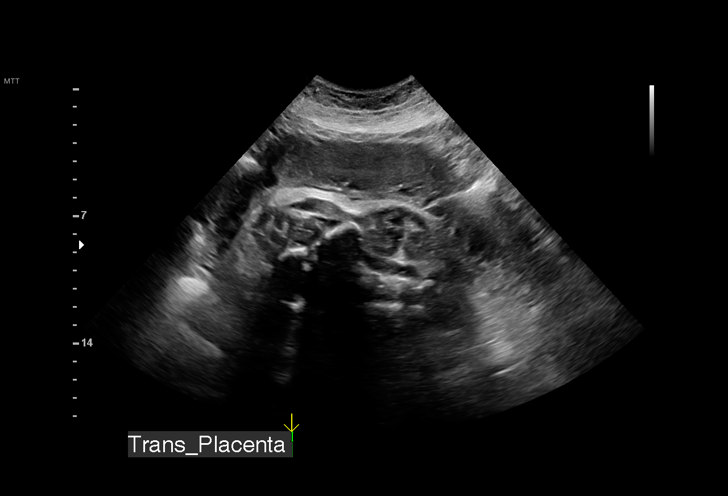
[im 34/42]
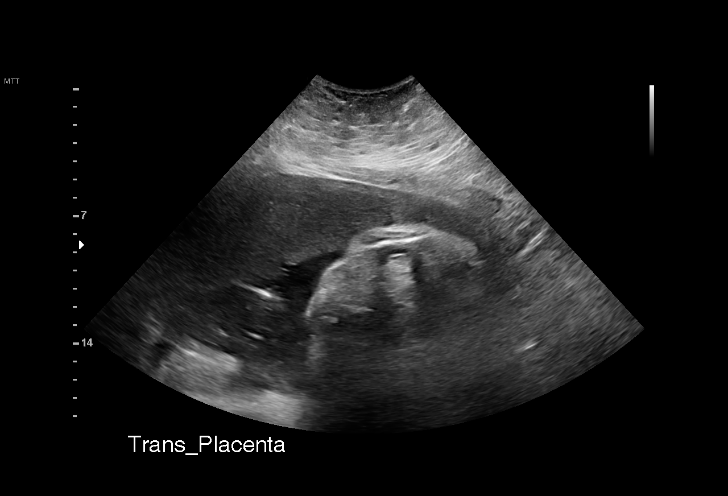
[im 37/42]
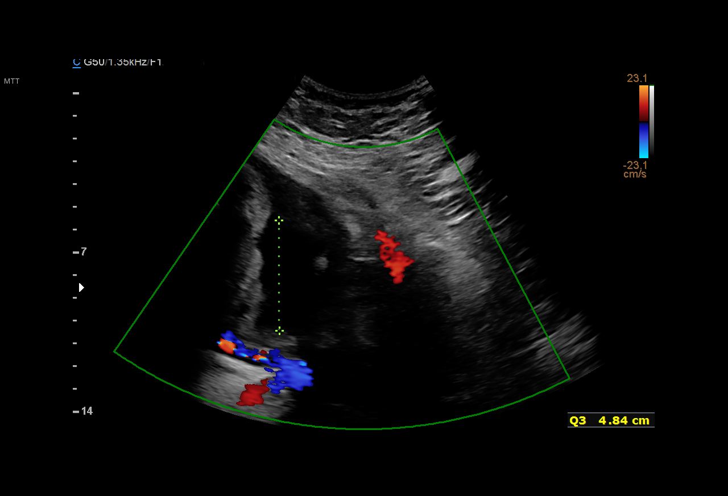
[im 40/42]
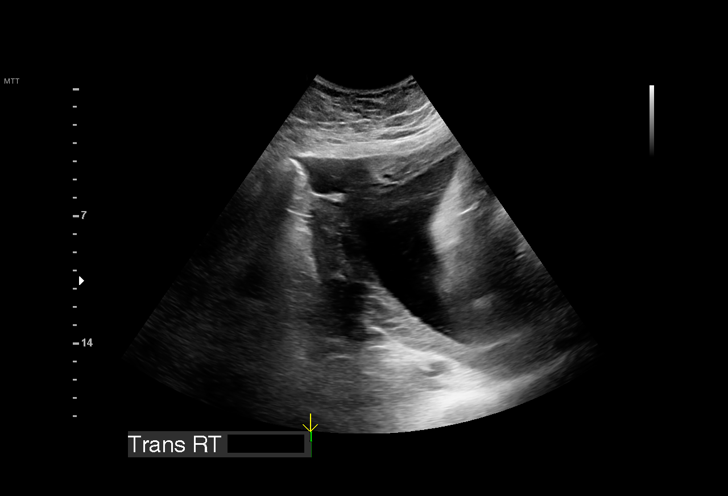

[13 of 28 positions shown; findings below may reference images not displayed]

ZEINAB NP

Indications

 37 weeks gestation of pregnancy
 Encounter for other antenatal screening
 follow-up
 Obesity complicating pregnancy, third
 trimester (BMI 49)
 Advanced maternal age multigravida 35+,
 third trimester
 Negative Cystic Fibrosis
Fetal Evaluation

 Num Of Fetuses:         1
 Fetal Heart Rate(bpm):  143
 Cardiac Activity:       Observed
 Presentation:           Cephalic
 Placenta:               Anterior
 P. Cord Insertion:      Previously Visualized

 Amniotic Fluid
 AFI FV:      Within normal limits

 AFI Sum(cm)     %Tile       Largest Pocket(cm)
 15.3            58

 RUQ(cm)       RLQ(cm)       LUQ(cm)        LLQ(cm)
 5.8           4.7           0
Biophysical Evaluation

 Amniotic F.V:   Pocket => 2 cm             F. Tone:        Observed
 F. Movement:    Observed                   Score:          [DATE]
 F. Breathing:   Observed
Biometry

 BPD:      93.6  mm     G. Age:  38w 1d         87  %    CI:        76.28   %    70 - 86
                                                         FL/HC:      20.7   %    20.8 -
 HC:      339.6  mm     G. Age:  39w 0d         71  %    HC/AC:      0.98        0.92 -
 AC:      345.1  mm     G. Age:  38w 3d         91  %    FL/BPD:     75.0   %    71 - 87
 FL:       70.2  mm     G. Age:  36w 0d         21  %    FL/AC:      20.3   %    20 - 24
 LV:        7.6  mm

 Est. FW:    4441  gm      7 lb 6 oz     76  %
OB History

 Gravidity:    5         Term:   4        Prem:   0        SAB:   0
 TOP:          0       Ectopic:  0        Living: 4
Gestational Age

 LMP:           37w 1d        Date:  05/21/20                 EDD:   02/25/21
 U/S Today:     37w 6d                                        EDD:   02/20/21
 Best:          37w 1d     Det. By:  LMP  (05/21/20)          EDD:   02/25/21
Anatomy

 Cranium:               Previously seen        LVOT:                   Previously seen
 Cavum:                 Appears normal         Aortic Arch:            Previously seen
 Ventricles:            Appears normal         Ductal Arch:            Previously seen
 Choroid Plexus:        Previously seen        Diaphragm:              Appears normal
 Cerebellum:            Previously seen        Stomach:                Appears normal, left
                                                                       sided
 Posterior Fossa:       Previously seen        Abdomen:                Previously seen
 Nuchal Fold:           Not applicable (>20    Abdominal Wall:         Previously seen
                        wks GA)
 Face:                  Orbits and profile     Cord Vessels:           Previously seen
                        previously seen
 Lips:                  Previously seen        Kidneys:                Appear normal
 Palate:                Not well visualized    Bladder:                Appears normal
 Thoracic:              Appears normal         Spine:                  Previously seen
 Heart:                 Appears normal         Upper Extremities:      Visualized
                        (4CH, axis, and                                previously
                        situs)
 RVOT:                  Previously seen        Lower Extremities:      Previously seen

 Other:  Male gender previously seen. Left Heel prev. visualized. Hands not
         well visualized.  Technically difficult due to maternal habitus and fetal
         position.
Cervix Uterus Adnexa
 Cervix
 Not visualized (advanced GA >77wks)

 Uterus
 No abnormality visualized.

 Right Ovary
 Not visualized.

 Left Ovary
 Not visualized.

 Cul De Sac
 No free fluid seen.

 Adnexa
 No abnormality visualized.
Impression

 Maternal obesity.  Patient return for fetal growth assessment.
 Blood pressures today at her office were 144/97 and repeat
 130/70 mmHg.  Patient complained of slight headache and
 swelling of ankles for the last few weeks.  She does not have
 visual disturbances or right upper quadrant pain or vaginal
 bleeding.

 Fetal growth is appropriate for gestational age.  Amniotic fluid
 is normal and good fetal activity seen.  Antenatal testing is
 reassuring.  BPP [DATE].

 Patient has blood pressure cuff at home.  I encouraged her to
 check her blood pressures daily and discussed the normal
 parameters.  Patient was advised to call her provider if
 systolic blood pressures are greater than 140 mmHg and/or
 diastolic blood pressures greater than 90 mmHg.
Recommendations

 - Continue weekly BPP till delivery.
                 Rea, Jalissa

## 2023-04-29 ENCOUNTER — Other Ambulatory Visit: Payer: Self-pay | Admitting: Medical Genetics

## 2023-07-13 ENCOUNTER — Other Ambulatory Visit: Payer: Self-pay

## 2023-07-13 DIAGNOSIS — Z006 Encounter for examination for normal comparison and control in clinical research program: Secondary | ICD-10-CM

## 2023-08-04 LAB — GENECONNECT MOLECULAR SCREEN

## 2023-08-05 ENCOUNTER — Telehealth: Payer: Self-pay | Admitting: Medical Genetics

## 2023-08-05 ENCOUNTER — Other Ambulatory Visit: Payer: Self-pay | Admitting: Medical Genetics

## 2023-08-05 DIAGNOSIS — Z006 Encounter for examination for normal comparison and control in clinical research program: Secondary | ICD-10-CM

## 2023-08-05 NOTE — Progress Notes (Signed)
 Initial result was a TNP. New order requested. Confirmed consent on file.

## 2023-08-05 NOTE — Telephone Encounter (Signed)
 [facility]  @today @ 1:23 PM  Confirmed I was speaking with Jyl W Brendy Ficek [phone] by using name and DOB. Informed participant the reason for this call is to follow-up on a recent sample the participant provided at one of the [facility] lab locations. Informed participant the test was not able to be completed with this sample and apologized for the inconvenience. Participant was requested to provide a new sample at one of our participating labs at no cost so that participant can continue participation and receive test results. Participant agreed to provide another sample. Participant was provided the [employer] program website to learn why this may have happened. Participant was thanked for their time and continued support of the above study.

## 2023-08-25 ENCOUNTER — Other Ambulatory Visit: Payer: Self-pay

## 2023-10-29 ENCOUNTER — Ambulatory Visit: Admitting: Family Medicine

## 2023-10-29 VITALS — BP 131/82 | HR 75 | Ht 68.0 in | Wt 315.4 lb

## 2023-10-29 DIAGNOSIS — Z7689 Persons encountering health services in other specified circumstances: Secondary | ICD-10-CM | POA: Diagnosis not present

## 2023-10-29 DIAGNOSIS — E66813 Obesity, class 3: Secondary | ICD-10-CM

## 2023-10-29 DIAGNOSIS — Z6841 Body Mass Index (BMI) 40.0 and over, adult: Secondary | ICD-10-CM

## 2023-10-29 MED ORDER — PHENTERMINE HCL 37.5 MG PO TABS: 37.5000 mg | ORAL_TABLET | Freq: Every day | ORAL | 0 refills | Status: DC

## 2023-10-29 NOTE — Progress Notes (Signed)
 New Patient Office Visit  Subjective    Patient ID: Crystal Lester, female    DOB: 12-20-1980  Age: 43 y.o. MRN: [id number]  CC:  Chief Complaint  Patient presents with   Establish Care    Pt reports concerns with weight    HPI Crystal Lester presents to establish care and with concerns regarding being overweight. She reports that she has already lost some weight with dieting and exercising.    Outpatient Encounter Medications as of 10/29/2023  Medication Sig   phentermine  (ADIPEX-P ) 37.5 MG tablet Take 1 tablet (37.5 mg total) by mouth daily before breakfast.   acetaminophen  (TYLENOL ) 500 MG tablet Take 2 tablets (1,000 mg total) by mouth every 8 (eight) hours as needed (pain).   ferrous sulfate 325 (65 FE) MG tablet Take 325 mg by mouth daily with breakfast.   furosemide  (LASIX ) 20 MG tablet Take 1 tablet (20 mg total) by mouth 2 (two) times daily for 3 days.   ibuprofen  (ADVIL ) 600 MG tablet Take 1 tablet (600 mg total) by mouth every 6 (six) hours as needed (pain).   omeprazole (PRILOSEC OTC) 20 MG tablet Take 20 mg by mouth daily as needed (heartburn).   Prenatal Vit-Fe Fumarate-FA (PRENATAL MULTIVITAMIN) TABS tablet Take 1 tablet by mouth daily at 12 noon.   No facility-administered encounter medications on file as of 10/29/2023.    Past Medical History:  Diagnosis Date   Anemia    Chest pain with low risk for cardiac etiology 06/20/2019   Chest wall discomfort 06/20/2019   Depression    Hypoglycemia    Rapid palpitations 06/20/2019    Past Surgical History:  Procedure Laterality Date   BIOPSY  11/24/2019   Procedure: BIOPSY;  Surgeon: Elicia Claw, MD;  Location: [facility];  Service: Gastroenterology;;   COLONOSCOPY WITH PROPOFOL  N/A 11/24/2019   Procedure: COLONOSCOPY WITH PROPOFOL ;  Surgeon: Elicia Claw, MD;  Location: [facility];  Service: Gastroenterology;  Laterality: N/A;   POLYPECTOMY  11/24/2019   Procedure: POLYPECTOMY;  Surgeon:  Elicia Claw, MD;  Location: [facility];  Service: Gastroenterology;;    Family History  Problem Relation Age of Onset   Cancer Other    Hypertension Mother    Diabetes Mellitus II Mother    Heart attack Father 67   CAD Father    Hypertension Father    Colon cancer Father    Healthy Brother    Hypertension Paternal Grandmother    Hypertension Paternal Grandfather    Stroke Paternal Grandfather     Social History   Socioeconomic History   Marital status: Married    Spouse name: Not on file   Number of children: Not on file   Years of education: Not on file   Highest education level: 12th grade  Occupational History   Not on file  Tobacco Use   Smoking status: Former    Current packs/day: 0.00    Types: Cigarettes    Quit date: 2018    Years since quitting: 7.6   Smokeless tobacco: Never  Vaping Use   Vaping status: Never Used  Substance and Sexual Activity   Alcohol use: Not Currently    Comment: social   Drug use: No   Sexual activity: Yes    Partners: Male  Other Topics Concern   Not on file  Social History Narrative   Married mother of 4-from first marriage 55, 24, 27 and 42 years old. ->  All the 42 year old with  her..   Current husband is disabled-lost to likes to complications of diabetes and CKD/ESRD.   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   Social Drivers of Health   Financial Resource Strain: Medium Risk (10/29/2023)   Overall Financial Resource Strain (CARDIA)    Difficulty of Paying Living Expenses: Somewhat hard  Food Insecurity: Food Insecurity Present (10/29/2023)   Hunger Vital Sign    Worried About Running Out of Food in the Last Year: Sometimes true    Ran Out of Food in the Last Year: Never true  Transportation Needs: No Transportation Needs (10/29/2023)   PRAPARE - Administrator, Civil Service (Medical): No    Lack of Transportation (Non-Medical): No  Physical  Activity: Sufficiently Active (10/29/2023)   Exercise Vital Sign    Days of Exercise per Week: 4 days    Minutes of Exercise per Session: 60 min  Stress: No Stress Concern Present (10/29/2023)   [employer] of Occupational Health - Occupational Stress Questionnaire    Feeling of Stress: Not at all  Social Connections: Moderately Isolated (10/29/2023)   Social Connection and Isolation Panel    Frequency of Communication with Friends and Family: More than three times a week    Frequency of Social Gatherings with Friends and Family: Once a week    Attends Religious Services: 1 to 4 times per year    Active Member of [employer] or Organizations: No    Attends Banker Meetings: Not on file    Marital Status: Widowed  Intimate Partner Violence: Not on file    Review of Systems  All other systems reviewed and are negative.       Objective   BP 131/82   Pulse 75   Ht 5' 8 (1.727 m)   Wt (!) 315 lb 6.4 oz (143.1 kg)   LMP 10/22/2023 (Exact Date)   SpO2 99%   BMI 47.96 kg/m   Physical Exam Vitals and nursing note reviewed.  Constitutional:      General: She is not in acute distress.    Appearance: She is obese.  Cardiovascular:     Rate and Rhythm: Normal rate and regular rhythm.  Pulmonary:     Effort: Pulmonary effort is normal.     Breath sounds: Normal breath sounds.  Abdominal:     Palpations: Abdomen is soft.     Tenderness: There is no abdominal tenderness.  Neurological:     General: No focal deficit present.     Mental Status: She is alert and oriented to person, place, and time.         Assessment & Plan:   Class 3 severe obesity due to excess calories without serious comorbidity with body mass index (BMI) of 45.0 to 49.9 in adult -     Amb ref to Medical Nutrition Therapy-MNT  Encounter to establish care  Other orders -     Phentermine  HCl; Take 1 tablet (37.5 mg total) by mouth daily before breakfast.  Dispense: 30 tablet; Refill: 0      Return in about 4 weeks (around 11/26/2023) for follow up, weight management.   Tanda Raguel SQUIBB, MD

## 2023-11-27 ENCOUNTER — Other Ambulatory Visit: Payer: Self-pay | Admitting: Family Medicine

## 2023-12-02 ENCOUNTER — Telehealth: Payer: Self-pay | Admitting: Family Medicine

## 2023-12-02 NOTE — Telephone Encounter (Signed)
 Copied from CRM #[id number]. Topic: Clinical - Medication Question >> Dec 02, 2023  2:25 PM Avram MATSU wrote: Reason for CRM: pt is out of her medication and was wondering if she can get a medication phentermine  (ADIPEX-P ) 37.5 MG tablet [[id number]] She is concerned that it will effect her weight. Please advise [phone] (M)

## 2023-12-07 ENCOUNTER — Ambulatory Visit: Admitting: Family Medicine

## 2023-12-11 NOTE — Progress Notes (Deleted)
 Medical Nutrition Therapy  Appointment Start time:  ***  Appointment End time:  ***  Primary concerns today: ***  Referral diagnosis: Class 3 severe obesity due to excess calories without serious comorbidity with body mass index (BMI) of 45.0 to 49.9 in adult [facility])  Preferred learning style: *** (auditory, visual, hands on, no preference indicated) Learning readiness: *** (not ready, contemplating, ready, change in progress)   NUTRITION ASSESSMENT    Clinical Medical Hx: *** Medications: *** Labs: *** Notable Signs/Symptoms: ***  Lifestyle & Dietary Hx ***  Estimated daily fluid intake: *** oz Supplements: *** Sleep: *** Stress / self-care: *** Current average weekly physical activity: ***  24-Hr Dietary Recall First Meal: *** Snack: *** Second Meal: *** Snack: *** Third Meal: *** Snack: *** Beverages: ***  Estimated Energy Needs Calories: *** Carbohydrate: ***g Protein: ***g Fat: ***g   NUTRITION DIAGNOSIS  {CHL AMB NUTRITIONAL DIAGNOSIS:[id number]}   NUTRITION INTERVENTION  Nutrition education (E-1) on the following topics:  ***  Handouts Provided Include  ***  Learning Style & Readiness for Change Teaching method utilized: Visual & Auditory  Demonstrated degree of understanding via: Teach Back  Barriers to learning/adherence to lifestyle change: ***  Goals Established by Pt ***   MONITORING & EVALUATION Dietary intake, weekly physical activity, and *** in ***.  Next Steps  Patient is to ***.

## 2023-12-15 ENCOUNTER — Ambulatory Visit: Admitting: Family Medicine

## 2023-12-15 ENCOUNTER — Encounter: Payer: Self-pay | Admitting: Family Medicine

## 2023-12-15 VITALS — BP 124/85 | HR 72 | Temp 97.7°F | Resp 16 | Ht >= 80 in | Wt 302.0 lb

## 2023-12-15 DIAGNOSIS — E66811 Obesity, class 1: Secondary | ICD-10-CM | POA: Diagnosis not present

## 2023-12-15 DIAGNOSIS — Z6833 Body mass index (BMI) 33.0-33.9, adult: Secondary | ICD-10-CM | POA: Diagnosis not present

## 2023-12-15 DIAGNOSIS — E6609 Other obesity due to excess calories: Secondary | ICD-10-CM

## 2023-12-15 DIAGNOSIS — Z7689 Persons encountering health services in other specified circumstances: Secondary | ICD-10-CM

## 2023-12-15 MED ORDER — PHENTERMINE HCL 37.5 MG PO TABS
37.5000 mg | ORAL_TABLET | Freq: Every day | ORAL | 0 refills | Status: DC
Start: 2023-12-15 — End: 2024-01-12

## 2023-12-15 NOTE — Progress Notes (Signed)
 Established Patient Office Visit  Subjective    Patient ID: Crystal Lester, female    DOB: Jan 16, 1981  Age: 43 y.o. MRN: [id number]  CC:  Chief Complaint  Patient presents with   Weight Check    HPI Crystal Lester presents for routine weight management. Patient reports that she is doing well with present management and denies acute complaints.   Outpatient Encounter Medications as of 12/15/2023  Medication Sig   phentermine  (ADIPEX-P ) 37.5 MG tablet Take 1 tablet (37.5 mg total) by mouth daily before breakfast.   [DISCONTINUED] phentermine  (ADIPEX-P ) 37.5 MG tablet Take 1 tablet (37.5 mg total) by mouth daily before breakfast.   No facility-administered encounter medications on file as of 12/15/2023.    Past Medical History:  Diagnosis Date   Anemia    Chest pain with low risk for cardiac etiology 06/20/2019   Chest wall discomfort 06/20/2019   Depression    Hypoglycemia    Rapid palpitations 06/20/2019    Past Surgical History:  Procedure Laterality Date   BIOPSY  11/24/2019   Procedure: BIOPSY;  Surgeon: Elicia Claw, MD;  Location: [facility];  Service: Gastroenterology;;   COLONOSCOPY WITH PROPOFOL  N/A 11/24/2019   Procedure: COLONOSCOPY WITH PROPOFOL ;  Surgeon: Elicia Claw, MD;  Location: [facility];  Service: Gastroenterology;  Laterality: N/A;   POLYPECTOMY  11/24/2019   Procedure: POLYPECTOMY;  Surgeon: Elicia Claw, MD;  Location: [facility];  Service: Gastroenterology;;    Family History  Problem Relation Age of Onset   Cancer Other    Hypertension Mother    Diabetes Mellitus II Mother    Heart attack Father 32   CAD Father    Hypertension Father    Colon cancer Father    Healthy Brother    Hypertension Paternal Grandmother    Hypertension Paternal Grandfather    Stroke Paternal Grandfather     Social History   Socioeconomic History   Marital status: Married    Spouse name: Not on file   Number of children: Not on  file   Years of education: Not on file   Highest education level: 12th grade  Occupational History   Not on file  Tobacco Use   Smoking status: Former    Current packs/day: 0.00    Types: Cigarettes    Quit date: 2018    Years since quitting: 7.7   Smokeless tobacco: Never  Vaping Use   Vaping status: Never Used  Substance and Sexual Activity   Alcohol use: Not Currently    Comment: social   Drug use: No   Sexual activity: Yes    Partners: Male  Other Topics Concern   Not on file  Social History Narrative   Married mother of 4-from first marriage 40, 58, 63 and 42 years old. ->  All the 42 year old with her..   Current husband is disabled-lost to likes to complications of diabetes and CKD/ESRD.   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   Social Drivers of Health   Financial Resource Strain: Medium Risk (10/29/2023)   Overall Financial Resource Strain (CARDIA)    Difficulty of Paying Living Expenses: Somewhat hard  Food Insecurity: Food Insecurity Present (10/29/2023)   Hunger Vital Sign    Worried About Running Out of Food in the Last Year: Sometimes true    Ran Out of Food in the Last Year: Never true  Transportation Needs: No Transportation Needs (10/29/2023)  PRAPARE - Administrator, Civil Service (Medical): No    Lack of Transportation (Non-Medical): No  Physical Activity: Sufficiently Active (10/29/2023)   Exercise Vital Sign    Days of Exercise per Week: 4 days    Minutes of Exercise per Session: 60 min  Stress: No Stress Concern Present (10/29/2023)   [employer] of Occupational Health - Occupational Stress Questionnaire    Feeling of Stress: Not at all  Social Connections: Moderately Isolated (10/29/2023)   Social Connection and Isolation Panel    Frequency of Communication with Friends and Family: More than three times a week    Frequency of Social Gatherings with Friends and Family: Once a week     Attends Religious Services: 1 to 4 times per year    Active Member of [employer] or Organizations: No    Attends Banker Meetings: Not on file    Marital Status: Widowed  Intimate Partner Violence: Not on file    Review of Systems  All other systems reviewed and are negative.       Objective    BP 124/85   Pulse 72   Temp 97.7 F (36.5 C) (Oral)   Resp 16   Ht 6' 8 (2.032 m)   Wt (!) 302 lb (137 kg)   SpO2 98%   BMI 33.18 kg/m   Physical Exam Vitals and nursing note reviewed.  Constitutional:      General: She is not in acute distress.    Appearance: She is obese.  Cardiovascular:     Rate and Rhythm: Normal rate and regular rhythm.  Pulmonary:     Effort: Pulmonary effort is normal.     Breath sounds: Normal breath sounds.  Abdominal:     Palpations: Abdomen is soft.     Tenderness: There is no abdominal tenderness.  Neurological:     General: No focal deficit present.     Mental Status: She is alert and oriented to person, place, and time.         Assessment & Plan:   Encounter for weight management  Class 1 obesity due to excess calories without serious comorbidity with body mass index (BMI) of 33.0 to 33.9 in adult  Other orders -     Phentermine  HCl; Take 1 tablet (37.5 mg total) by mouth daily before breakfast.  Dispense: 30 tablet; Refill: 0   The patient will adhere to a reduced calorie diet of   2400   calories per day. The patient will continue lifestyle modifications including diet and   90 minutes of exercise per week.    Return in about 4 weeks (around 01/12/2024) for follow up, weight management.   Tanda Raguel SQUIBB, MD

## 2023-12-16 ENCOUNTER — Ambulatory Visit: Admitting: Dietician

## 2023-12-16 DIAGNOSIS — E66813 Obesity, class 3: Secondary | ICD-10-CM

## 2023-12-21 ENCOUNTER — Other Ambulatory Visit: Payer: Self-pay | Admitting: *Deleted

## 2023-12-21 DIAGNOSIS — Z006 Encounter for examination for normal comparison and control in clinical research program: Secondary | ICD-10-CM

## 2023-12-24 ENCOUNTER — Ambulatory Visit

## 2023-12-24 ENCOUNTER — Ambulatory Visit
Admission: EM | Admit: 2023-12-24 | Discharge: 2023-12-24 | Disposition: A | Discharge status: Home / Self Care | Attending: Family Medicine | Admitting: Family Medicine

## 2023-12-24 DIAGNOSIS — F411 Generalized anxiety disorder: Secondary | ICD-10-CM | POA: Insufficient documentation

## 2023-12-24 DIAGNOSIS — Z6841 Body Mass Index (BMI) 40.0 and over, adult: Secondary | ICD-10-CM | POA: Insufficient documentation

## 2023-12-24 DIAGNOSIS — E7800 Pure hypercholesterolemia, unspecified: Secondary | ICD-10-CM | POA: Insufficient documentation

## 2023-12-24 DIAGNOSIS — I83813 Varicose veins of bilateral lower extremities with pain: Secondary | ICD-10-CM | POA: Insufficient documentation

## 2023-12-24 DIAGNOSIS — M25562 Pain in left knee: Secondary | ICD-10-CM | POA: Diagnosis not present

## 2023-12-24 DIAGNOSIS — M79671 Pain in right foot: Secondary | ICD-10-CM

## 2023-12-24 DIAGNOSIS — F332 Major depressive disorder, recurrent severe without psychotic features: Secondary | ICD-10-CM | POA: Insufficient documentation

## 2023-12-24 DIAGNOSIS — K219 Gastro-esophageal reflux disease without esophagitis: Secondary | ICD-10-CM | POA: Insufficient documentation

## 2023-12-24 DIAGNOSIS — F4321 Adjustment disorder with depressed mood: Secondary | ICD-10-CM | POA: Insufficient documentation

## 2023-12-24 NOTE — ED Triage Notes (Addendum)
 Patient reports bending down to retrieve a toy from underneath her son's bed when one of the toys pierced the skin on her left knee. This incident occurred over the past weekend and the knee remains sore with swelling. She recalls hearing a pop at the time of injury, and is currently experiencing burning, shooting, and stabbing pain. There is no fever present.  Additionally, the patient believes her right foot (specifically the 3rd and 4th toes) may be broken. She stepped down awkwardly about two months ago causing and injury, but the pain persists with minimal improvement with occasional swelling.

## 2023-12-24 NOTE — ED Provider Notes (Signed)
 [facility]    CSN: [id number] Arrival date & time: 12/24/23  1055      History   Chief Complaint Chief Complaint  Patient presents with   Pain    HPI Crystal Lester is a 43 y.o. female.   HPI Here for pain and swelling of her left anterior knee.  About 5 or 6 days ago she was bending down to retrieve a toy and she knelt on a toy of that caused pain and a pop underneath her left kneecap.  No fall or trauma  she continues to swell some there and is hot and burns.  She can extend her knee though it causes pain.  She also has some pain of her anterior right foot.  A few months ago she stepped down awkwardly and caused some pain around the 3rd and 4th toes.  She states it moves oddly when she bears weight on the foot.  800 mg of ibuprofen  has been helping some  She is allergic to iodine Past Medical History:  Diagnosis Date   Anemia    Chest pain with low risk for cardiac etiology 06/20/2019   Chest wall discomfort 06/20/2019   Depression    Hypoglycemia    Rapid palpitations 06/20/2019    Patient Active Problem List   Diagnosis Date Noted   Body mass index (BMI) 50.0-59.9, adult (HCC) 12/24/2023   Gastroesophageal reflux disease 12/24/2023   Generalized anxiety disorder 12/24/2023   Grief 12/24/2023   Morbid obesity (HCC) 12/24/2023   Pure hypercholesterolemia 12/24/2023   Severe episode of recurrent major depressive disorder, without psychotic features (HCC) 12/24/2023   Varicose veins of bilateral lower extremities with pain 12/24/2023   Pre-eclampsia 02/17/2021   Postpartum hemorrhage 02/16/2021   Vaginal delivery 02/14/2021    Past Surgical History:  Procedure Laterality Date   BIOPSY  11/24/2019   Procedure: BIOPSY;  Surgeon: Elicia Claw, MD;  Location: [facility];  Service: Gastroenterology;;   COLONOSCOPY WITH PROPOFOL  N/A 11/24/2019   Procedure: COLONOSCOPY WITH PROPOFOL ;  Surgeon: Elicia Claw, MD;  Location: [facility];   Service: Gastroenterology;  Laterality: N/A;   POLYPECTOMY  11/24/2019   Procedure: POLYPECTOMY;  Surgeon: Elicia Claw, MD;  Location: [facility];  Service: Gastroenterology;;    OB History     Gravida  5   Para  5   Term  5   Preterm      AB      Living  5      SAB      IAB      Ectopic      Multiple  0   Live Births  5            Home Medications    Prior to Admission medications   Medication Sig Start Date End Date Taking? Authorizing Provider  hydrOXYzine (ATARAX) 25 MG tablet 1 tablet as needed Orally every 8 hrs; Duration: 30 day(s) 12/30/19  Yes [provider]  Acetaminophen  (TYLENOL  ARTHRITIS PAIN PO) 1 tab PO PRN    [provider]  buPROPion (WELLBUTRIN XL) 150 MG 24 hr tablet 1 tablet in the morning Orally Once a day; Duration: 30 day(s)    [provider]  FLUoxetine (PROZAC) 20 MG capsule 3 capsules Orally Once a day; Duration: 90 days    [provider]  omeprazole (PRILOSEC) 20 MG capsule 1 capsule 30 minutes before morning meal Orally Once a day    [provider]  phentermine  (  ADIPEX-P ) 37.5 MG tablet Take 1 tablet (37.5 mg total) by mouth daily before breakfast. 12/15/23   Tanda Bleacher, MD    Family History Family History  Problem Relation Age of Onset   Cancer Other    Hypertension Mother    Diabetes Mellitus II Mother    Heart attack Father 36   CAD Father    Hypertension Father    Colon cancer Father    Healthy Brother    Hypertension Paternal Grandmother    Hypertension Paternal Grandfather    Stroke Paternal Grandfather     Social History Social History   Tobacco Use   Smoking status: Former    Current packs/day: 0.00    Types: Cigarettes    Quit date: 2018    Years since quitting: 7.7   Smokeless tobacco: Never  Vaping Use   Vaping status: Never Used  Substance Use Topics   Alcohol use: Not Currently    Comment: social   Drug use: No     Allergies    Iodine, Latex, and Other   Review of Systems Review of Systems   Physical Exam Triage Vital Signs ED Triage Vitals  Encounter Vitals Group     BP 12/24/23 1122 125/76     Girls Systolic BP Percentile --      Girls Diastolic BP Percentile --      Boys Systolic BP Percentile --      Boys Diastolic BP Percentile --      Pulse Rate 12/24/23 1122 77     Resp 12/24/23 1122 20     Temp 12/24/23 1122 98.6 F (37 C)     Temp Source 12/24/23 1122 Oral     SpO2 12/24/23 1122 98 %     Weight 12/24/23 1120 292 lb (132.5 kg)     Height 12/24/23 1120 5' 8 (1.727 m)     Head Circumference --      Peak Flow --      Pain Score 12/24/23 1115 7     Pain Loc --      Pain Education --      Exclude from Growth Chart --    No data found.  Updated Vital Signs BP 125/76 (BP Location: Left Arm)   Pulse 77   Temp 98.6 F (37 C) (Oral)   Resp 20   Ht 5' 8 (1.727 m)   Wt 132.5 kg   LMP  (LMP Unknown)   SpO2 98%   BMI 44.40 kg/m   Visual Acuity Right Eye Distance:   Left Eye Distance:   Bilateral Distance:    Right Eye Near:   Left Eye Near:    Bilateral Near:     Physical Exam Vitals reviewed.  Constitutional:      General: She is not in acute distress.    Appearance: She is not ill-appearing, toxic-appearing or diaphoretic.  Musculoskeletal:     Comments: There is some swelling about 5 cm in diameter to the left inferior anterior aspect of the left knee.  There is no swelling of the right foot or erythema or ecchymosis.  The foot does seem to almost expand when she bears weight.  No bony deformity in either place.  Skin:    Coloration: Skin is not pale.  Neurological:     Mental Status: She is alert.      UC Treatments / Results  Labs (all labs ordered are listed, but only abnormal results are displayed) Labs Reviewed - No data  to display  EKG   Radiology DG Foot Complete Right Result Date: 12/24/2023 EXAM: 3 or more VIEW(S) XRAY OF THE RIGHT FOOT  12/24/2023 11:52:03 AM COMPARISON: None available. CLINICAL HISTORY: Right foot and right 3rd and 4th toe pain. FINDINGS: BONES AND JOINTS: No acute fracture. No focal osseous lesion. No joint dislocation. SOFT TISSUES: The soft tissues are unremarkable. IMPRESSION: 1. No significant abnormality. Electronically signed by: Lynwood Seip MD 12/24/2023 12:24 PM EDT RP Workstation: [id number]   DG Knee Complete 4 Views Left Result Date: 12/24/2023 EXAM: 4 OR MORE VIEW(S) XRAY OF THE LEFT KNEE 12/24/2023 11:52:03 AM COMPARISON: None available. CLINICAL HISTORY: Pain. Injury. Swelling. FINDINGS: BONES AND JOINTS: No acute fracture. No focal osseous lesion. No joint dislocation. No significant joint effusion. No significant degenerative changes. SOFT TISSUES: The soft tissues are unremarkable. IMPRESSION: 1. No significant abnormality. Electronically signed by: Lynwood Seip MD 12/24/2023 12:22 PM EDT RP Workstation: [id number]    Procedures Procedures (including critical care time)  Medications Ordered in UC Medications - No data to display  Initial Impression / Assessment and Plan / UC Course  I have reviewed the triage vital signs and the nursing notes.  Pertinent labs & imaging results that were available during my care of the patient were reviewed by me and considered in my medical decision making (see chart for details).     She has tried an Ace wrap and it did not help.  We will supply her in a knee sleeve brace.  She will continue the ibuprofen  as needed.  She is given COND information for orthopedics and for podiatry. Final Clinical Impressions(s) / UC Diagnoses   Final diagnoses:  Right foot pain  Acute pain of left knee     Discharge Instructions      Neither set of x-ray showed any broken bones.  Continue the ibuprofen  as needed       ED Prescriptions   None    PDMP not reviewed this encounter.   Vonna Sharlet POUR, MD 12/24/23 [phone]

## 2023-12-24 NOTE — Discharge Instructions (Signed)
 Neither set of x-ray showed any broken bones.  Continue the ibuprofen  as needed

## 2024-01-12 ENCOUNTER — Ambulatory Visit: Admitting: Family Medicine

## 2024-01-12 VITALS — BP 110/71 | HR 63 | Ht 68.0 in | Wt 289.4 lb

## 2024-01-12 DIAGNOSIS — Z7689 Persons encountering health services in other specified circumstances: Secondary | ICD-10-CM

## 2024-01-12 DIAGNOSIS — E66813 Obesity, class 3: Secondary | ICD-10-CM | POA: Diagnosis not present

## 2024-01-12 DIAGNOSIS — Z6841 Body Mass Index (BMI) 40.0 and over, adult: Secondary | ICD-10-CM | POA: Diagnosis not present

## 2024-01-12 DIAGNOSIS — Z713 Dietary counseling and surveillance: Secondary | ICD-10-CM

## 2024-01-13 ENCOUNTER — Encounter: Payer: Self-pay | Admitting: Family Medicine

## 2024-01-13 MED ORDER — PHENTERMINE HCL 37.5 MG PO TABS: 37.5000 mg | ORAL_TABLET | Freq: Every day | ORAL | 0 refills | Status: DC

## 2024-01-13 NOTE — Progress Notes (Signed)
 Established Patient Office Visit  Subjective    Patient ID: Crystal Lester, female    DOB: [birth year]  Age: 43 y.o. MRN: [id number]  CC:  Chief Complaint  Patient presents with   Medical Management of Chronic Issues    HPI Crystal Lester presents for routine weight management. Patient reports doing well on present management. She continues to modify her diet and work out.   Outpatient Encounter Medications as of 01/12/2024  Medication Sig   Acetaminophen  (TYLENOL  ARTHRITIS PAIN PO) 1 tab PO PRN   buPROPion (WELLBUTRIN XL) 150 MG 24 hr tablet 1 tablet in the morning Orally Once a day; Duration: 30 day(s)   FLUoxetine (PROZAC) 20 MG capsule 3 capsules Orally Once a day; Duration: 90 days   hydrOXYzine (ATARAX) 25 MG tablet 1 tablet as needed Orally every 8 hrs; Duration: 30 day(s)   omeprazole (PRILOSEC) 20 MG capsule 1 capsule 30 minutes before morning meal Orally Once a day   phentermine  (ADIPEX-P ) 37.5 MG tablet Take 1 tablet (37.5 mg total) by mouth daily before breakfast.   [DISCONTINUED] phentermine  (ADIPEX-P ) 37.5 MG tablet Take 1 tablet (37.5 mg total) by mouth daily before breakfast.   No facility-administered encounter medications on file as of 01/12/2024.    Past Medical History:  Diagnosis Date   Anemia    Chest pain with low risk for cardiac etiology 06/20/2019   Chest wall discomfort 06/20/2019   Depression    Hypoglycemia    Rapid palpitations 06/20/2019    Past Surgical History:  Procedure Laterality Date   BIOPSY  11/24/2019   Procedure: BIOPSY;  Surgeon: Elicia Claw, MD;  Location: [facility];  Service: Gastroenterology;;   COLONOSCOPY WITH PROPOFOL  N/A 11/24/2019   Procedure: COLONOSCOPY WITH PROPOFOL ;  Surgeon: Elicia Claw, MD;  Location[facility];  Service: Gastroenterology;  Laterality: N/A;   POLYPECTOMY  11/24/2019   Procedure: POLYPECTOMY;  Surgeon: Elicia Claw, MD;  Location: [facility];  Service:  Gastroenterology;;    Family History  Problem Relation Age of Onset   Cancer Other    Hypertension Mother    Diabetes Mellitus II Mother    Heart attack Father 24   CAD Father    Hypertension Father    Colon cancer Father    Healthy Brother    Hypertension Paternal Grandmother    Hypertension Paternal Grandfather    Stroke Paternal Grandfather     Social History   Socioeconomic History   Marital status: Married    Spouse name: Not on file   Number of children: Not on file   Years of education: Not on file   Highest education level: 12th grade  Occupational History   Not on file  Tobacco Use   Smoking status: Former    Current packs/day: 0.00    Types: Cigarettes    Quit date: 2018    Years since quitting: 7.8   Smokeless tobacco: Never  Vaping Use   Vaping status: Never Used  Substance and Sexual Activity   Alcohol use: Not Currently    Comment: social   Drug use: No   Sexual activity: Yes    Partners: Male  Other Topics Concern   Not on file  Social History Narrative   Married mother of 4-from first marriage 8, 64, 69 and 42 years old. ->  All the 42 year old with her..   Current husband is disabled-lost to likes to complications of diabetes and CKD/ESRD.   -=> Is under lots of  social stress as a caregiver for her husband, and only breadwinner..   Social Drivers of Health   Financial Resource Strain: Medium Risk (10/29/2023)   Overall Financial Resource Strain (CARDIA)    Difficulty of Paying Living Expenses: Somewhat hard  Food Insecurity: Food Insecurity Present (10/29/2023)   Hunger Vital Sign    Worried About Running Out of Food in the Last Year: Sometimes true    Ran Out of Food in the Last Year: Never true  Transportation Needs: No Transportation Needs (10/29/2023)   PRAPARE - Administrator, Civil Service (Medical): No    Lack of Transportation (Non-Medical): No  Physical Activity: Sufficiently Active (10/29/2023)   Exercise Vital Sign     Days of Exercise per Week: 4 days    Minutes of Exercise per Session: 60 min  Stress: No Stress Concern Present (10/29/2023)   [employer] of Occupational Health - Occupational Stress Questionnaire    Feeling of Stress: Not at all  Social Connections: Moderately Isolated (10/29/2023)   Social Connection and Isolation Panel    Frequency of Communication with Friends and Family: More than three times a week    Frequency of Social Gatherings with Friends and Family: Once a week    Attends Religious Services: 1 to 4 times per year    Active Member of [employer] or Organizations: No    Attends Banker Meetings: Not on file    Marital Status: Widowed  Intimate Partner Violence: Not on file    Review of Systems  All other systems reviewed and are negative.       Objective    BP 110/71   Pulse 63   Ht 5' 8 (1.727 m)   Wt 289 lb 6.4 oz (131.3 kg)   LMP 12/29/2023 (Approximate)   SpO2 98%   Breastfeeding No   BMI 44.00 kg/m   Physical Exam Vitals and nursing note reviewed.  Constitutional:      General: She is not in acute distress.    Appearance: She is obese.  Cardiovascular:     Rate and Rhythm: Normal rate and regular rhythm.  Pulmonary:     Effort: Pulmonary effort is normal.     Breath sounds: Normal breath sounds.  Abdominal:     Palpations: Abdomen is soft.     Tenderness: There is no abdominal tenderness.  Neurological:     General: No focal deficit present.     Mental Status: She is alert and oriented to person, place, and time.         Assessment & Plan:   Encounter for weight management  Class 3 severe obesity due to excess calories without serious comorbidity with body mass index (BMI) of 40.0 to 44.9 in adult [facility])  Other orders -     Phentermine  HCl; Take 1 tablet (37.5 mg total) by mouth daily before breakfast.  Dispense: 30 tablet; Refill: 0   The patient will adhere to a reduced calorie diet of   2400   calories per day. The  patient will continue lifestyle modifications including diet and  90   minutes of exercise per week.    Return in about 4 weeks (around 02/09/2024) for follow up, weight management.   Tanda Raguel SQUIBB, MD

## 2024-02-12 ENCOUNTER — Ambulatory Visit: Admitting: Family Medicine

## 2024-04-06 ENCOUNTER — Ambulatory Visit: Admitting: Family Medicine

## 2024-04-06 VITALS — BP 121/74 | HR 89 | Ht 68.0 in | Wt 303.2 lb

## 2024-04-06 DIAGNOSIS — Z6841 Body Mass Index (BMI) 40.0 and over, adult: Secondary | ICD-10-CM | POA: Diagnosis not present

## 2024-04-06 DIAGNOSIS — E66813 Obesity, class 3: Secondary | ICD-10-CM | POA: Diagnosis not present

## 2024-04-06 DIAGNOSIS — R399 Unspecified symptoms and signs involving the genitourinary system: Secondary | ICD-10-CM

## 2024-04-06 DIAGNOSIS — Z7689 Persons encountering health services in other specified circumstances: Secondary | ICD-10-CM

## 2024-04-06 LAB — POCT URINALYSIS DIP (CLINITEK)
Blood, UA: NEGATIVE
Glucose, UA: NEGATIVE mg/dL
Ketones, POC UA: NEGATIVE mg/dL
Leukocytes, UA: NEGATIVE
Nitrite, UA: NEGATIVE
POC PROTEIN,UA: 100 — AB
Spec Grav, UA: >=1.03 — AB
Urobilinogen, UA: 1 U/dL
pH, UA: 6

## 2024-04-06 MED ORDER — PHENTERMINE HCL 37.5 MG PO TABS: 37.5000 mg | ORAL_TABLET | Freq: Every day | ORAL | 0 refills | Status: AC

## 2024-04-06 NOTE — Progress Notes (Unsigned)
 "  Established Patient Office Visit  Subjective    Patient ID: Crystal Lester, female    DOB: 1981/02/23  Age: 44 y.o. MRN: [id number]  CC:  Chief Complaint  Patient presents with   Medical Management of Chronic Issues    HPI Crystal Lester presents for routine weight management. Patient also reports some urinary changes over the past several days.   Outpatient Encounter Medications as of 04/06/2024  Medication Sig   meloxicam (MOBIC) 7.5 MG tablet Take 7.5 mg by mouth daily.   Acetaminophen  (TYLENOL  ARTHRITIS PAIN PO) 1 tab PO PRN   phentermine  (ADIPEX-P ) 37.5 MG tablet Take 1 tablet (37.5 mg total) by mouth daily before breakfast.   [DISCONTINUED] buPROPion (WELLBUTRIN XL) 150 MG 24 hr tablet 1 tablet in the morning Orally Once a day; Duration: 30 day(s)   [DISCONTINUED] FLUoxetine (PROZAC) 20 MG capsule 3 capsules Orally Once a day; Duration: 90 days   [DISCONTINUED] hydrOXYzine (ATARAX) 25 MG tablet 1 tablet as needed Orally every 8 hrs; Duration: 30 day(s)   [DISCONTINUED] omeprazole (PRILOSEC) 20 MG capsule 1 capsule 30 minutes before morning meal Orally Once a day   [DISCONTINUED] phentermine  (ADIPEX-P ) 37.5 MG tablet Take 1 tablet (37.5 mg total) by mouth daily before breakfast.   No facility-administered encounter medications on file as of 04/06/2024.    Past Medical History:  Diagnosis Date   Anemia    Chest pain with low risk for cardiac etiology 06/20/2019   Chest wall discomfort 06/20/2019   Depression    Hypoglycemia    Rapid palpitations 06/20/2019    Past Surgical History:  Procedure Laterality Date   BIOPSY  11/24/2019   Procedure: BIOPSY;  Surgeon: Elicia Claw, MD;  Location: [facility];  Service: Gastroenterology;;   COLONOSCOPY WITH PROPOFOL  N/A 11/24/2019   Procedure: COLONOSCOPY WITH PROPOFOL ;  Surgeon: Elicia Claw, MD;  Location: [facility];  Service: Gastroenterology;  Laterality: N/A;   POLYPECTOMY  11/24/2019   Procedure:  POLYPECTOMY;  Surgeon: Elicia Claw, MD;  Location: [facility];  Service: Gastroenterology;;    Family History  Problem Relation Age of Onset   Cancer Other    Hypertension Mother    Diabetes Mellitus II Mother    Heart attack Father 9   CAD Father    Hypertension Father    Colon cancer Father    Healthy Brother    Hypertension Paternal Grandmother    Hypertension Paternal Grandfather    Stroke Paternal Grandfather     Social History   Socioeconomic History   Marital status: Married    Spouse name: Not on file   Number of children: Not on file   Years of education: Not on file   Highest education level: GED or equivalent  Occupational History   Not on file  Tobacco Use   Smoking status: Former    Current packs/day: 0.00    Average packs/day: 0.5 packs/day    Types: Cigarettes    Quit date: 2018    Years since quitting: 8.0   Smokeless tobacco: Never  Vaping Use   Vaping status: Never Used  Substance and Sexual Activity   Alcohol use: Not Currently    Comment: social   Drug use: No   Sexual activity: Yes    Partners: Male  Other Topics Concern   Not on file  Social History Narrative   Married mother of 4-from first marriage 33, 52, 49 and 43 years old. ->  All the 43 year old with her.SABRA  Current husband is disabled-lost to likes to complications of diabetes and CKD/ESRD.   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   Social Drivers of Health   Tobacco Use: Medium Risk (04/06/2024)   Patient History    Smoking Tobacco Use: Former    Smokeless Tobacco Use: Never    Passive Exposure: Not on file  Financial Resource Strain: Low Risk (04/06/2024)   Overall Financial Resource Strain (CARDIA)    Difficulty of Paying Living Expenses: Not very hard  Food Insecurity: No Food Insecurity (04/06/2024)   Epic    Worried About Programme Researcher, Broadcasting/film/video in the Last Year: Never true    Ran Out of Food in the Last Year: Never  true  Transportation Needs: No Transportation Needs (04/06/2024)   Epic    Lack of Transportation (Medical): No    Lack of Transportation (Non-Medical): No  Physical Activity: Insufficiently Active (04/06/2024)   Exercise Vital Sign    Days of Exercise per Week: 3 days    Minutes of Exercise per Session: 30 min  Stress: No Stress Concern Present (04/06/2024)   [employer] of Occupational Health - Occupational Stress Questionnaire    Feeling of Stress: Not at all  Social Connections: Moderately Isolated (04/06/2024)   Social Connection and Isolation Panel    Frequency of Communication with Friends and Family: More than three times a week    Frequency of Social Gatherings with Friends and Family: Twice a week    Attends Religious Services: More than 4 times per year    Active Member of [employer] or Organizations: No    Attends Banker Meetings: Not on file    Marital Status: Widowed  Intimate Partner Violence: Not At Risk (04/06/2024)   Epic    Fear of Current or Ex-Partner: No    Emotionally Abused: No    Physically Abused: No    Sexually Abused: No  Depression (PHQ2-9): Medium Risk (04/06/2024)   Depression (PHQ2-9)    PHQ-2 Score: 6  Alcohol Screen: Low Risk (04/06/2024)   Alcohol Screen    Last Alcohol Screening Score (AUDIT): 1  Housing: High Risk (04/06/2024)   Epic    Unable to Pay for Housing in the Last Year: Yes    Number of Times Moved in the Last Year: 0    Homeless in the Last Year: No  Utilities: Not At Risk (04/06/2024)   Epic    Threatened with loss of utilities: No  Health Literacy: Adequate Health Literacy (04/06/2024)   B1300 Health Literacy    Frequency of need for help with medical instructions: Never    Review of Systems  Genitourinary:  Positive for dysuria.  All other systems reviewed and are negative.       Objective    BP 121/74   Pulse 89   Ht 5' 8 (1.727 m)   Wt (!) 303 lb 3.2 oz (137.5 kg)   SpO2 99%   BMI 46.10 kg/m    Physical Exam Vitals and nursing note reviewed.  Constitutional:      General: She is not in acute distress.    Appearance: She is obese.  Cardiovascular:     Rate and Rhythm: Normal rate and regular rhythm.  Pulmonary:     Effort: Pulmonary effort is normal.     Breath sounds: Normal breath sounds.  Abdominal:     Palpations: Abdomen is soft.     Tenderness: There is  no abdominal tenderness.  Neurological:     General: No focal deficit present.     Mental Status: She is alert and oriented to person, place, and time.         Assessment & Plan:  1. Encounter for weight management (Primary) Patient has gained weight while off med. Phentermine  refilled.   2. Class 3 severe obesity due to excess calories without serious comorbidity with body mass index (BMI) of 45.0 to 49.9 in adult [facility]) As above  3. UTI symptoms Patient to increase appropriate fluids daily - POCT URINALYSIS DIP (CLINITEK)    Return in about 4 weeks (around 05/04/2024) for follow up, weight management.   Tanda Raguel SQUIBB, MD  "

## 2024-04-07 ENCOUNTER — Ambulatory Visit: Payer: Self-pay | Admitting: Family Medicine

## 2024-04-07 ENCOUNTER — Encounter: Payer: Self-pay | Admitting: Family Medicine

## 2024-05-04 ENCOUNTER — Ambulatory Visit: Payer: Self-pay | Admitting: Family Medicine
